# Patient Record
Sex: Female | Born: 1990
Health system: Southern US, Community
[De-identification: ages and names within clinical notes are randomized; demographics above are authoritative.]

## PROBLEM LIST (undated history)

## (undated) ENCOUNTER — Inpatient Hospital Stay (HOSPITAL_COMMUNITY): Payer: Self-pay

## (undated) DIAGNOSIS — D649 Anemia, unspecified: Secondary | ICD-10-CM

## (undated) DIAGNOSIS — K219 Gastro-esophageal reflux disease without esophagitis: Secondary | ICD-10-CM

## (undated) DIAGNOSIS — G43909 Migraine, unspecified, not intractable, without status migrainosus: Secondary | ICD-10-CM

## (undated) DIAGNOSIS — N1330 Unspecified hydronephrosis: Secondary | ICD-10-CM

## (undated) DIAGNOSIS — Z8619 Personal history of other infectious and parasitic diseases: Secondary | ICD-10-CM

## (undated) HISTORY — DX: Gastro-esophageal reflux disease without esophagitis: K21.9

## (undated) HISTORY — PX: NO PAST SURGERIES: SHX2092

## (undated) HISTORY — DX: Anemia, unspecified: D64.9

## (undated) HISTORY — DX: Migraine, unspecified, not intractable, without status migrainosus: G43.909

## (undated) HISTORY — DX: Unspecified hydronephrosis: N13.30

## (undated) HISTORY — DX: Personal history of other infectious and parasitic diseases: Z86.19

---

## 2012-05-12 NOTE — L&D Delivery Note (Signed)
Delivery Note At 1:28 PM a viable and healthy female was delivered via Vaginal, Spontaneous Delivery (Presentation: Middle Occiput Posterior) compound left hand.  APGAR: 8, 9; weight .  6lb 7 oz Placenta status: Intact, Spontaneous. Not sent  Cord: 3 vessels with the following complications: Short.  Cord pH: none  Anesthesia: Epidural  Episiotomy: None Lacerations: 1st degree perineal, left vaginal sulcus Suture Repair: 3.0 chromic Est. Blood Loss (mL): 200  Mom to postpartum.  Baby to nursery-stable.  Cindy Dodson A 01/05/2013, 1:56 PM

## 2012-09-06 LAB — OB RESULTS CONSOLE RPR: RPR: NONREACTIVE

## 2012-09-06 LAB — OB RESULTS CONSOLE ANTIBODY SCREEN: Antibody Screen: NEGATIVE

## 2012-09-06 LAB — OB RESULTS CONSOLE HEPATITIS B SURFACE ANTIGEN: Hepatitis B Surface Ag: NEGATIVE

## 2012-09-06 LAB — OB RESULTS CONSOLE ABO/RH: RH Type: POSITIVE

## 2012-09-06 LAB — OB RESULTS CONSOLE RUBELLA ANTIBODY, IGM: Rubella: IMMUNE

## 2012-09-06 LAB — OB RESULTS CONSOLE HIV ANTIBODY (ROUTINE TESTING): HIV: NONREACTIVE

## 2012-09-14 LAB — OB RESULTS CONSOLE GC/CHLAMYDIA
Chlamydia: NEGATIVE
Gonorrhea: NEGATIVE

## 2012-09-15 ENCOUNTER — Inpatient Hospital Stay (HOSPITAL_COMMUNITY): Admission: AD | Admit: 2012-09-15 | Payer: Self-pay | Source: Ambulatory Visit | Admitting: Obstetrics and Gynecology

## 2012-10-22 LAB — OB RESULTS CONSOLE RPR: RPR: NONREACTIVE

## 2012-11-24 ENCOUNTER — Other Ambulatory Visit: Payer: Self-pay | Admitting: Obstetrics and Gynecology

## 2012-11-24 DIAGNOSIS — N1330 Unspecified hydronephrosis: Secondary | ICD-10-CM

## 2012-11-29 ENCOUNTER — Other Ambulatory Visit: Payer: Self-pay

## 2012-12-15 ENCOUNTER — Ambulatory Visit: Payer: Self-pay | Admitting: Internal Medicine

## 2012-12-28 ENCOUNTER — Encounter (HOSPITAL_COMMUNITY): Payer: Self-pay | Admitting: *Deleted

## 2012-12-28 ENCOUNTER — Telehealth (HOSPITAL_COMMUNITY): Payer: Self-pay | Admitting: *Deleted

## 2012-12-28 NOTE — Telephone Encounter (Signed)
Preadmission screen  

## 2012-12-30 ENCOUNTER — Other Ambulatory Visit: Payer: Self-pay | Admitting: Obstetrics and Gynecology

## 2012-12-31 ENCOUNTER — Telehealth (HOSPITAL_COMMUNITY): Payer: Self-pay | Admitting: *Deleted

## 2013-01-04 ENCOUNTER — Encounter (HOSPITAL_COMMUNITY): Payer: Self-pay

## 2013-01-04 ENCOUNTER — Inpatient Hospital Stay (HOSPITAL_COMMUNITY)
Admission: AD | Admit: 2013-01-04 | Discharge: 2013-01-04 | Disposition: A | Discharge status: Home / Self Care | Payer: BC Managed Care – PPO | Source: Ambulatory Visit | Attending: Obstetrics and Gynecology | Admitting: Obstetrics and Gynecology

## 2013-01-04 DIAGNOSIS — O479 False labor, unspecified: Secondary | ICD-10-CM | POA: Insufficient documentation

## 2013-01-04 LAB — OB RESULTS CONSOLE GBS: GBS: NEGATIVE

## 2013-01-04 NOTE — MAU Note (Signed)
Contractions every 15 minutes since 230 this morning. Denies leaking of fluid or vaginal bleeding. Positive fetal movement.

## 2013-01-05 ENCOUNTER — Encounter (HOSPITAL_COMMUNITY): Payer: Self-pay

## 2013-01-05 ENCOUNTER — Inpatient Hospital Stay (HOSPITAL_COMMUNITY): Payer: BC Managed Care – PPO | Admitting: Anesthesiology

## 2013-01-05 ENCOUNTER — Inpatient Hospital Stay (HOSPITAL_COMMUNITY)
Admission: RE | Admit: 2013-01-05 | Discharge: 2013-01-06 | DRG: 373 | Disposition: A | Discharge status: Home / Self Care | Payer: BC Managed Care – PPO | Source: Ambulatory Visit | Attending: Obstetrics and Gynecology | Admitting: Obstetrics and Gynecology

## 2013-01-05 ENCOUNTER — Encounter (HOSPITAL_COMMUNITY): Payer: Self-pay | Admitting: Anesthesiology

## 2013-01-05 DIAGNOSIS — O328XX0 Maternal care for other malpresentation of fetus, not applicable or unspecified: Secondary | ICD-10-CM | POA: Diagnosis present

## 2013-01-05 LAB — TYPE AND SCREEN
ABO/RH(D): B POS
Antibody Screen: NEGATIVE

## 2013-01-05 LAB — CBC
HCT: 34.8 % — ABNORMAL LOW (ref 36.0–46.0)
Hemoglobin: 11.7 g/dL — ABNORMAL LOW (ref 12.0–15.0)
MCH: 24.6 pg — ABNORMAL LOW (ref 26.0–34.0)
MCHC: 33.6 g/dL (ref 30.0–36.0)
MCV: 73.1 fL — ABNORMAL LOW (ref 78.0–100.0)
Platelets: 198 10*3/uL (ref 150–400)
RBC: 4.76 MIL/uL (ref 3.87–5.11)
RDW: 14 % (ref 11.5–15.5)
WBC: 8.6 10*3/uL (ref 4.0–10.5)

## 2013-01-05 LAB — RPR: RPR Ser Ql: NONREACTIVE

## 2013-01-05 LAB — ABO/RH: ABO/RH(D): B POS

## 2013-01-05 MED ORDER — ACETAMINOPHEN 325 MG PO TABS: 650.0000 mg | ORAL_TABLET | ORAL | Status: DC | PRN

## 2013-01-05 MED ORDER — OXYTOCIN BOLUS FROM INFUSION
500.0000 mL | INTRAVENOUS | Status: DC
  Administered 2013-01-05: 500 mL via INTRAVENOUS

## 2013-01-05 MED ORDER — IBUPROFEN 600 MG PO TABS
600.0000 mg | ORAL_TABLET | Freq: Four times a day (QID) | ORAL | Status: DC
  Administered 2013-01-05 – 2013-01-06 (×4): 600 mg via ORAL
  Filled 2013-01-05 (×4): qty 1

## 2013-01-05 MED ORDER — EPHEDRINE 5 MG/ML INJ
10.0000 mg | INTRAVENOUS | Status: DC | PRN
  Filled 2013-01-05: qty 2
  Filled 2013-01-05: qty 4

## 2013-01-05 MED ORDER — ONDANSETRON HCL 4 MG/2ML IJ SOLN: 4.0000 mg | Freq: Four times a day (QID) | INTRAMUSCULAR | Status: DC | PRN

## 2013-01-05 MED ORDER — FENTANYL 2.5 MCG/ML BUPIVACAINE 1/10 % EPIDURAL INFUSION (WH - ANES)
INTRAMUSCULAR | Status: DC | PRN
  Administered 2013-01-05: 14 mL/h via EPIDURAL

## 2013-01-05 MED ORDER — FERROUS FUMARATE 325 (106 FE) MG PO TABS
1.0000 | ORAL_TABLET | Freq: Every day | ORAL | Status: DC
  Administered 2013-01-05: 106 mg via ORAL
  Filled 2013-01-05 (×3): qty 1

## 2013-01-05 MED ORDER — OXYCODONE-ACETAMINOPHEN 5-325 MG PO TABS
1.0000 | ORAL_TABLET | ORAL | Status: DC | PRN
Start: 2013-01-05 — End: 2013-01-06
  Administered 2013-01-05 – 2013-01-06 (×3): 1 via ORAL
  Filled 2013-01-05 (×3): qty 1

## 2013-01-05 MED ORDER — OXYTOCIN 40 UNITS IN LACTATED RINGERS INFUSION - SIMPLE MED
1.0000 m[IU]/min | INTRAVENOUS | Status: DC
  Administered 2013-01-05: 2 m[IU]/min via INTRAVENOUS
  Filled 2013-01-05: qty 1000

## 2013-01-05 MED ORDER — DIPHENHYDRAMINE HCL 50 MG/ML IJ SOLN: 12.5000 mg | INTRAMUSCULAR | Status: DC | PRN

## 2013-01-05 MED ORDER — CITRIC ACID-SODIUM CITRATE 334-500 MG/5ML PO SOLN: 30.0000 mL | ORAL | Status: DC | PRN

## 2013-01-05 MED ORDER — SENNOSIDES-DOCUSATE SODIUM 8.6-50 MG PO TABS
2.0000 | ORAL_TABLET | Freq: Every day | ORAL | Status: DC
  Administered 2013-01-05: 2 via ORAL

## 2013-01-05 MED ORDER — LANOLIN HYDROUS EX OINT: TOPICAL_OINTMENT | CUTANEOUS | Status: DC | PRN

## 2013-01-05 MED ORDER — SIMETHICONE 80 MG PO CHEW: 80.0000 mg | CHEWABLE_TABLET | ORAL | Status: DC | PRN

## 2013-01-05 MED ORDER — BENZOCAINE-MENTHOL 20-0.5 % EX AERO
1.0000 "application " | INHALATION_SPRAY | CUTANEOUS | Status: DC | PRN
  Administered 2013-01-05: 1 via TOPICAL
  Filled 2013-01-05: qty 56

## 2013-01-05 MED ORDER — FENTANYL 2.5 MCG/ML BUPIVACAINE 1/10 % EPIDURAL INFUSION (WH - ANES)
14.0000 mL/h | INTRAMUSCULAR | Status: DC | PRN
  Filled 2013-01-05: qty 125

## 2013-01-05 MED ORDER — ONDANSETRON HCL 4 MG/2ML IJ SOLN: 4.0000 mg | INTRAMUSCULAR | Status: DC | PRN

## 2013-01-05 MED ORDER — DIBUCAINE 1 % RE OINT: 1.0000 "application " | TOPICAL_OINTMENT | RECTAL | Status: DC | PRN

## 2013-01-05 MED ORDER — ONDANSETRON HCL 4 MG PO TABS: 4.0000 mg | ORAL_TABLET | ORAL | Status: DC | PRN

## 2013-01-05 MED ORDER — LACTATED RINGERS IV SOLN
500.0000 mL | Freq: Once | INTRAVENOUS | Status: AC
  Administered 2013-01-05: 500 mL via INTRAVENOUS

## 2013-01-05 MED ORDER — FLEET ENEMA 7-19 GM/118ML RE ENEM: 1.0000 | ENEMA | Freq: Every day | RECTAL | Status: DC | PRN

## 2013-01-05 MED ORDER — LACTATED RINGERS IV SOLN
INTRAVENOUS | Status: DC
  Administered 2013-01-05 (×2): 125 mL/h via INTRAVENOUS

## 2013-01-05 MED ORDER — DIPHENHYDRAMINE HCL 25 MG PO CAPS: 25.0000 mg | ORAL_CAPSULE | Freq: Four times a day (QID) | ORAL | Status: DC | PRN

## 2013-01-05 MED ORDER — OXYCODONE-ACETAMINOPHEN 5-325 MG PO TABS: 1.0000 | ORAL_TABLET | ORAL | Status: DC | PRN

## 2013-01-05 MED ORDER — PHENYLEPHRINE 40 MCG/ML (10ML) SYRINGE FOR IV PUSH (FOR BLOOD PRESSURE SUPPORT)
80.0000 ug | PREFILLED_SYRINGE | INTRAVENOUS | Status: DC | PRN
  Filled 2013-01-05: qty 2
  Filled 2013-01-05: qty 5

## 2013-01-05 MED ORDER — PHENYLEPHRINE 40 MCG/ML (10ML) SYRINGE FOR IV PUSH (FOR BLOOD PRESSURE SUPPORT)
80.0000 ug | PREFILLED_SYRINGE | INTRAVENOUS | Status: DC | PRN
  Filled 2013-01-05: qty 2

## 2013-01-05 MED ORDER — LIDOCAINE HCL (PF) 1 % IJ SOLN
30.0000 mL | INTRAMUSCULAR | Status: DC | PRN
  Filled 2013-01-05: qty 30

## 2013-01-05 MED ORDER — IBUPROFEN 600 MG PO TABS: 600.0000 mg | ORAL_TABLET | Freq: Four times a day (QID) | ORAL | Status: DC | PRN

## 2013-01-05 MED ORDER — EPHEDRINE 5 MG/ML INJ
10.0000 mg | INTRAVENOUS | Status: DC | PRN
  Filled 2013-01-05: qty 2

## 2013-01-05 MED ORDER — FERROUS SULFATE 325 (65 FE) MG PO TABS: 325.0000 mg | ORAL_TABLET | Freq: Two times a day (BID) | ORAL | Status: DC

## 2013-01-05 MED ORDER — WITCH HAZEL-GLYCERIN EX PADS: 1.0000 "application " | MEDICATED_PAD | CUTANEOUS | Status: DC | PRN

## 2013-01-05 MED ORDER — LIDOCAINE HCL (PF) 1 % IJ SOLN
INTRAMUSCULAR | Status: DC | PRN
  Administered 2013-01-05 (×2): 4 mL

## 2013-01-05 MED ORDER — TERBUTALINE SULFATE 1 MG/ML IJ SOLN: 0.2500 mg | Freq: Once | INTRAMUSCULAR | Status: DC | PRN

## 2013-01-05 MED ORDER — ZOLPIDEM TARTRATE 5 MG PO TABS: 5.0000 mg | ORAL_TABLET | Freq: Every evening | ORAL | Status: DC | PRN

## 2013-01-05 MED ORDER — OXYTOCIN 40 UNITS IN LACTATED RINGERS INFUSION - SIMPLE MED: 62.5000 mL/h | INTRAVENOUS | Status: DC

## 2013-01-05 MED ORDER — PRENATAL MULTIVITAMIN CH
1.0000 | ORAL_TABLET | Freq: Every day | ORAL | Status: DC
  Administered 2013-01-06: 1 via ORAL
  Filled 2013-01-05: qty 1

## 2013-01-05 MED ORDER — LACTATED RINGERS IV SOLN: 500.0000 mL | INTRAVENOUS | Status: DC | PRN

## 2013-01-05 NOTE — H&P (Signed)
Cindy Dodson is a 22 y.o. female presenting for induction  @ 39 weeks 2nd to favorable cervix   OB History   Grav Para Term Preterm Abortions TAB SAB Ect Mult Living   2 1 1  0 0 0 0 0 0 1     Past Medical History  Diagnosis Date  . GERD (gastroesophageal reflux disease)   . Hx of varicella   . Anemia     microcytic hypochromic anemia  . Hydronephrosis    Past Surgical History  Procedure Laterality Date  . No past surgeries     Family History: family history includes Cancer in her cousin, father, and paternal grandfather; Diabetes in her maternal aunt, maternal grandfather, maternal grandmother, maternal uncle, and paternal grandmother. Social History:  reports that she has never smoked. She has never used smokeless tobacco. She reports that she does not drink alcohol or use illicit drugs.  ROS:  Negative  General ROS: negative Genito-Urinary ROS: negative  Dilation: 4 Effacement (%): 70 Station: -3 Exam by:: Dr Cherly Hensen Blood pressure 129/84, pulse 82, temperature 97.7 F (36.5 C), temperature source Oral, resp. rate 20, height 5\' 4"  (1.626 m), weight 107.502 kg (237 lb). PHYSICAL EXAM: General: alert, cooperative and no distress Resp: clear to auscultation bilaterally Cardio: regular rate and rhythm, S1, S2 normal, no murmur, click, rub or gallop GI: gravid nontender Extremities: no edema, redness or tenderness in the calves or thighs   Prenatal labs: ABO, Rh: B/Positive/-- (04/28 0000) Antibody: Negative (04/28 0000) Rubella:  Immune RPR: Nonreactive (06/13 0000)  HBsAg: Negative (04/28 0000)  HIV: Non-reactive (04/28 0000)  GBS: Negative (08/26 0000)   Assessment/Plan: Term gestation GBS cx neg P) admit routine labs. Amniotomy. IV PCN Epidural   Cindy Dodson A 01/05/2013, 8:57 AM

## 2013-01-05 NOTE — Anesthesia Procedure Notes (Signed)
Epidural Patient location during procedure: OB Start time: 01/05/2013 10:21 AM  Staffing Anesthesiologist: Ozell Ferrera A. Performed by: anesthesiologist   Preanesthetic Checklist Completed: patient identified, site marked, surgical consent, pre-op evaluation, timeout performed, IV checked, risks and benefits discussed and monitors and equipment checked  Epidural Patient position: sitting Prep: site prepped and draped and DuraPrep Patient monitoring: continuous pulse ox and blood pressure Approach: midline Injection technique: LOR air  Needle:  Needle type: Tuohy  Needle gauge: 17 G Needle length: 9 cm and 9 Needle insertion depth: 7 cm Catheter type: closed end flexible Catheter size: 19 Gauge Catheter at skin depth: 12 cm Test dose: negative and Other  Assessment Events: blood not aspirated, injection not painful, no injection resistance, negative IV test and no paresthesia  Additional Notes Patient identified. Risks and benefits discussed including failed block, incomplete  Pain control, post dural puncture headache, nerve damage, paralysis, blood pressure Changes, nausea, vomiting, reactions to medications-both toxic and allergic and post Partum back pain. All questions were answered. Patient expressed understanding and wished to proceed. Sterile technique was used throughout procedure. Epidural site was Dressed with sterile barrier dressing. No paresthesias, signs of intravascular injection Or signs of intrathecal spread were encountered.  Patient was more comfortable after the epidural was dosed. Please see RN's note for documentation of vital signs and FHR which are stable.

## 2013-01-05 NOTE — Anesthesia Postprocedure Evaluation (Signed)
Anesthesia Post Note  Patient: Cindy Dodson  Procedure(s) Performed: * No procedures listed *  Anesthesia type: Epidural  Patient location: Mother/Baby  Post pain: Pain level controlled  Post assessment: Post-op Vital signs reviewed  Last Vitals:  Filed Vitals:   01/05/13 1713  BP: 134/79  Pulse: 88  Temp: 36.9 C  Resp: 22    Post vital signs: Reviewed  Level of consciousness: awake  Complications: No apparent anesthesia complications

## 2013-01-05 NOTE — Anesthesia Preprocedure Evaluation (Signed)
Anesthesia Evaluation  Patient identified by MRN, date of birth, ID band Patient awake    Reviewed: Allergy & Precautions, H&P , Patient's Chart, lab work & pertinent test results  Airway Mallampati: III TM Distance: >3 FB Neck ROM: Full    Dental no notable dental hx. (+) Teeth Intact   Pulmonary neg pulmonary ROS,  breath sounds clear to auscultation  Pulmonary exam normal       Cardiovascular Rhythm:Regular Rate:Normal     Neuro/Psych negative neurological ROS  negative psych ROS   GI/Hepatic GERD-  Medicated and Controlled,  Endo/Other  Morbid obesity  Renal/GU Renal disease  negative genitourinary   Musculoskeletal negative musculoskeletal ROS (+)   Abdominal (+) + obese,   Peds  Hematology  (+) Blood dyscrasia, anemia ,   Anesthesia Other Findings   Reproductive/Obstetrics                           Anesthesia Physical Anesthesia Plan  ASA: III  Anesthesia Plan: Epidural   Post-op Pain Management:    Induction:   Airway Management Planned: Natural Airway  Additional Equipment:   Intra-op Plan:   Post-operative Plan:   Informed Consent: I have reviewed the patients History and Physical, chart, labs and discussed the procedure including the risks, benefits and alternatives for the proposed anesthesia with the patient or authorized representative who has indicated his/her understanding and acceptance.   Dental advisory given  Plan Discussed with: Anesthesiologist  Anesthesia Plan Comments:         Anesthesia Quick Evaluation

## 2013-01-06 LAB — CBC
HCT: 33.1 % — ABNORMAL LOW (ref 36.0–46.0)
Hemoglobin: 11.1 g/dL — ABNORMAL LOW (ref 12.0–15.0)
MCH: 24.6 pg — ABNORMAL LOW (ref 26.0–34.0)
MCHC: 33.5 g/dL (ref 30.0–36.0)
MCV: 73.4 fL — ABNORMAL LOW (ref 78.0–100.0)
Platelets: 177 10*3/uL (ref 150–400)
RBC: 4.51 MIL/uL (ref 3.87–5.11)
RDW: 14.2 % (ref 11.5–15.5)
WBC: 9.5 10*3/uL (ref 4.0–10.5)

## 2013-01-06 MED ORDER — OXYCODONE-ACETAMINOPHEN 5-325 MG PO TABS: 1.0000 | ORAL_TABLET | Freq: Four times a day (QID) | ORAL | Status: DC | PRN

## 2013-01-06 MED ORDER — IBUPROFEN 600 MG PO TABS: 600.0000 mg | ORAL_TABLET | Freq: Four times a day (QID) | ORAL | Status: DC

## 2013-01-06 NOTE — Lactation Note (Signed)
This note was copied from the chart of Cindy Dodson. Lactation Consultation Note Mom states breast feeding is going very well; c/o sore nipples during feedings. Discussed position and latch and the importance of using good pillow support for every feeding to maintain position and latch. Mom admits that she has not been using many pillows. Mom states she is going to use more pillow support and be more careful with getting a wide latch. Otherwise, mom states she does not have any other concerns. Enc mom to call if she has any questions or concerns.   Patient Name: Cindy Fleta Borgeson [id number] Date: 01/06/2013     Maternal Data    Feeding    LATCH Score/Interventions                      Lactation Tools Discussed/Used     Consult Status      Lenard Forth 01/06/2013, 11:47 AM

## 2013-01-06 NOTE — Progress Notes (Signed)
Patient ID: MARAL LAMPE, female   DOB: 1991/01/27, 22 y.o.   MRN: [id number] PPD # 1 SVD  S:  Reports feeling well             Tolerating po/ No nausea or vomiting             Bleeding is light             Pain controlled with ibuprofen (OTC) and narcotic analgesics including Percocet             Up ad lib / ambulatory / voiding without difficulties    Newborn  Information for the patient's newborn:  Hatsumi, Steinhart [[id number]]  female  breast feeding  / Circumcision done   O:  A & O x 3, in no apparent distress, very pleasant             VS:  Filed Vitals:   01/05/13 1532 01/05/13 1713 01/05/13 2110 01/06/13 0545  BP: 136/86 134/79 133/81 119/75  Pulse: 73 88 88 73  Temp: 98.7 F (37.1 C) 98.4 F (36.9 C) 97.9 F (36.6 C) 97.8 F (36.6 C)  TempSrc: Axillary Axillary Oral Oral  Resp: 20 22 18 20   Height:      Weight:      SpO2: 100% 100%      LABS:  Recent Labs  01/05/13 0900 01/06/13 0600  WBC 8.6 9.5  HGB 11.7* 11.1*  HCT 34.8* 33.1*  PLT 198 177    Blood type: --/--/B POS, B POS (08/27 0900)  Rubella: Immune (04/28 0000)     Lungs: Clear and unlabored  Heart: regular rate and rhythm / no murmurs  Abdomen: soft, non-tender, non-distended , normal bowel sounds             Fundus: firm, non-tender, U-1  Perineum: 1st degree repair healing well, no edema  Lochia: scant  Extremities: trace pedal edema, no calf pain or tenderness, no Homans    A/P: PPD # 1  22 y.o., [id number]   Active Problems:   Postpartum care following vaginal delivery  (8/27)   Doing well - stable status  Routine post partum orders  Increase water intake  Anticipate discharge tomorrow    Raelyn Mora, M, MSN, CNM 01/06/2013, 10:28 AM

## 2013-01-06 NOTE — Lactation Note (Signed)
This note was copied from the chart of Boy Tyrah Broers. Lactation Consultation Note MD request feeding assessment prior to discharge.  Baby showing feeding cues; mom ready to feed baby; mom preparing to feed with insufficient pillow support. Discussed using more pillows to get baby higher to the breast. Baby latched on with shallow latch; discussed with mom how to make latch wider to protect the nipples. Mom then states she feels more comfortable with the latch. Nipples hurt "a little", but more comfortable with good position and latch. Mom has no other concerns at this time.  Enc mom to continue frequent STS and cue based feeding, and to call if she has any concerns.   Patient Name: Boy Trinette Vera [id number] Date: 01/06/2013 Reason for consult: Follow-up assessment;MD order   Maternal Data    Feeding Feeding Type: Breast Milk  LATCH Score/Interventions Latch: Grasps breast easily, tongue down, lips flanged, rhythmical sucking.  Audible Swallowing: Spontaneous and intermittent  Type of Nipple: Everted at rest and after stimulation  Comfort (Breast/Nipple): Filling, red/small blisters or bruises, mild/mod discomfort  Problem noted: Mild/Moderate discomfort Interventions (Mild/moderate discomfort): Hand massage;Hand expression  Hold (Positioning): Assistance needed to correctly position infant at breast and maintain latch. Intervention(s): Support Pillows;Skin to skin  LATCH Score: 8  Lactation Tools Discussed/Used     Consult Status Consult Status: Complete    Lenard Forth 01/06/2013, 3:13 PM

## 2013-01-07 NOTE — Discharge Summary (Signed)
OBSTETRICAL DISCHARGE SUMMARY   Patient ID: Cindy Dodson MRN: [id number] DOB/AGE: Jun 22, 1990 22 y.o.  Admit date: 01/05/2013 Admission Diagnoses: IOL @ 39wks     Discharge date: 01/06/2013  Discharge Diagnoses: Term pregnancy, delivered  Reason for Admission: induction of labor  Prenatal history: G2P2002   EDC : 01/12/2013, by Other Basis  Prenatal care at [facility]  Primary provider : Anagabriela Jokerst Prenatal course complicated by anemia in pregnancy  Prenatal Labs: ABO, Rh: --/--/B POS, B POS (08/27 0900)  Antibody: NEG (08/27 0900) Rubella: Immune RPR: NON REACTIVE (08/27 0900)  HBsAg: Negative (04/28 0000)  HIV: Non-reactive (04/28 0000)  GBS: Negative (08/26 0000)   Labor Summary: IOL @ 39 weeks d/t favorable cervix / amniotomy / progressed to complete without complications /SVD of female infant / 1st degree perineal laceration and left vaginal sulcus laceration - repaired   Anesthesia: epidural Procedures: amniotomy Complications: 1st degree and vaginal sulcus laceration  Newborn Data:  Gender: female Feeding method : breast Circumcision: yes Weight: 6lbs 7oz Apgar: 8/9  Discharge Information: Condition: stable Activity: pelvic rest Diet: routine Medications: PNV, Ibuprofen and Percocet   Instructions: [name] / instructions reviewed Discharge to: home Follow up : [name] (Dr. Cherly Hensen) at 6 weeks postpartum  Signed: Kenard Gower, MSN, CNM 01/07/2013, 5:38 PM

## 2013-01-12 ENCOUNTER — Inpatient Hospital Stay (HOSPITAL_COMMUNITY): Payer: BC Managed Care – PPO

## 2013-06-08 ENCOUNTER — Telehealth: Payer: Self-pay | Admitting: General Practice

## 2013-06-08 NOTE — Telephone Encounter (Addendum)
Pt states you see both of her children and would like to know if you would accept her as a pt to be worked in really soon.? Pt  Is having frequent headaches and your first available in may/ poss April appt on a Wed

## 2013-06-09 ENCOUNTER — Ambulatory Visit: Payer: BC Managed Care – PPO | Admitting: Family

## 2013-06-12 NOTE — Telephone Encounter (Signed)
Can use Wednesday feb 18th 30 minute appt.

## 2013-06-13 NOTE — Telephone Encounter (Signed)
lmom for pt to make appt.

## 2013-06-15 NOTE — Telephone Encounter (Signed)
Done

## 2013-06-29 ENCOUNTER — Ambulatory Visit (INDEPENDENT_AMBULATORY_CARE_PROVIDER_SITE_OTHER): Payer: BC Managed Care – PPO | Admitting: Internal Medicine

## 2013-06-29 ENCOUNTER — Encounter: Payer: Self-pay | Admitting: Internal Medicine

## 2013-06-29 VITALS — BP 140/90 | HR 78 | Temp 98.4°F | Ht 64.5 in | Wt 209.0 lb

## 2013-06-29 DIAGNOSIS — Z833 Family history of diabetes mellitus: Secondary | ICD-10-CM | POA: Insufficient documentation

## 2013-06-29 DIAGNOSIS — L989 Disorder of the skin and subcutaneous tissue, unspecified: Secondary | ICD-10-CM | POA: Insufficient documentation

## 2013-06-29 DIAGNOSIS — Z23 Encounter for immunization: Secondary | ICD-10-CM

## 2013-06-29 DIAGNOSIS — Z299 Encounter for prophylactic measures, unspecified: Secondary | ICD-10-CM

## 2013-06-29 DIAGNOSIS — R519 Headache, unspecified: Secondary | ICD-10-CM | POA: Insufficient documentation

## 2013-06-29 DIAGNOSIS — L819 Disorder of pigmentation, unspecified: Secondary | ICD-10-CM

## 2013-06-29 DIAGNOSIS — Z801 Family history of malignant neoplasm of trachea, bronchus and lung: Secondary | ICD-10-CM | POA: Insufficient documentation

## 2013-06-29 DIAGNOSIS — R51 Headache: Secondary | ICD-10-CM

## 2013-06-29 DIAGNOSIS — D239 Other benign neoplasm of skin, unspecified: Secondary | ICD-10-CM

## 2013-06-29 NOTE — Assessment & Plan Note (Signed)
recent one sided ha prob migraine variant  Ho given tracking  And fu if  persistent or progressive

## 2013-06-29 NOTE — Patient Instructions (Addendum)
Headaches sound like migraiune  Track and fu if  persistent or progressive sometimes 2 aleve at beginning is helpful.Continue lifestyle intervention healthy eating and exercise . Get lab appt fasting preferred but water ok. 150 minutes of exercise weeks  ,  Lose weight  To healthy levels. Avoid trans fats and processed foods;  Increase fresh fruits and veges to 5 servings per day. And avoid sweet beverages  Including tea and juice.  Will arrange a derm consult to check the arm area .  Fu  depending   On labs  Or every 1-2 years      Insomnia Insomnia is frequent trouble falling and/or staying asleep. Insomnia can be a long term problem or a short term problem. Both are common. Insomnia can be a short term problem when the wakefulness is related to a certain stress or worry. Long term insomnia is often related to ongoing stress during waking hours and/or poor sleeping habits. Overtime, sleep deprivation itself can make the problem worse. Every little thing feels more severe because you are overtired and your ability to cope is decreased. CAUSES   Stress, anxiety, and depression.  Poor sleeping habits.  Distractions such as TV in the bedroom.  Naps close to bedtime.  Engaging in emotionally charged conversations before bed.  Technical reading before sleep.  Alcohol and other sedatives. They may make the problem worse. They can hurt normal sleep patterns and normal dream activity.  Stimulants such as caffeine for several hours prior to bedtime.  Pain syndromes and shortness of breath can cause insomnia.  Exercise late at night.  Changing time zones may cause sleeping problems (jet lag). It is sometimes helpful to have someone observe your sleeping patterns. They should look for periods of not breathing during the night (sleep apnea). They should also look to see how long those periods last. If you live alone or observers are uncertain, you can also be observed at a sleep clinic where  your sleep patterns will be professionally monitored. Sleep apnea requires a checkup and treatment. Give your caregivers your medical history. Give your caregivers observations your family has made about your sleep.  SYMPTOMS   Not feeling rested in the morning.  Anxiety and restlessness at bedtime.  Difficulty falling and staying asleep. TREATMENT   Your caregiver may prescribe treatment for an underlying medical disorders. Your caregiver can give advice or help if you are using alcohol or other drugs for self-medication. Treatment of underlying problems will usually eliminate insomnia problems.  Medications can be prescribed for short time use. They are generally not recommended for lengthy use.  Over-the-counter sleep medicines are not recommended for lengthy use. They can be habit forming.  You can promote easier sleeping by making lifestyle changes such as:  Using relaxation techniques that help with breathing and reduce muscle tension.  Exercising earlier in the day.  Changing your diet and the time of your last meal. No night time snacks.  Establish a regular time to go to bed.  Counseling can help with stressful problems and worry.  Soothing music and white noise may be helpful if there are background noises you cannot remove.  Stop tedious detailed work at least one hour before bedtime. HOME CARE INSTRUCTIONS   Keep a diary. Inform your caregiver about your progress. This includes any medication side effects. See your caregiver regularly. Take note of:  Times when you are asleep.  Times when you are awake during the night.  The quality of your sleep.  How you feel the next day. This information will help your caregiver care for you.  Get out of bed if you are still awake after 15 minutes. Read or do some quiet activity. Keep the lights down. Wait until you feel sleepy and go back to bed.  Keep regular sleeping and waking hours. Avoid naps.  Exercise  regularly.  Avoid distractions at bedtime. Distractions include watching television or engaging in any intense or detailed activity like attempting to balance the household checkbook.  Develop a bedtime ritual. Keep a familiar routine of bathing, brushing your teeth, climbing into bed at the same time each night, listening to soothing music. Routines increase the success of falling to sleep faster.  Use relaxation techniques. This can be using breathing and muscle tension release routines. It can also include visualizing peaceful scenes. You can also help control troubling or intruding thoughts by keeping your mind occupied with boring or repetitive thoughts like the old concept of counting sheep. You can make it more creative like imagining planting one beautiful flower after another in your backyard garden.  During your day, work to eliminate stress. When this is not possible use some of the previous suggestions to help reduce the anxiety that accompanies stressful situations. MAKE SURE YOU:   Understand these instructions.  Will watch your condition.  Will get help right away if you are not doing well or get worse. Document Released: 04/25/2000 Document Revised: 07/21/2011 Document Reviewed: 05/26/2007 [facility] Patient Information 2014 [place].  Exercise to Lose Weight Exercise and a healthy diet may help you lose weight. Your doctor may suggest specific exercises. EXERCISE IDEAS AND TIPS  Choose low-cost things you enjoy doing, such as walking, bicycling, or exercising to workout videos.  Take stairs instead of the elevator.  Walk during your lunch break.  Park your car further away from work or school.  Go to a gym or an exercise class.  Start with 5 to 10 minutes of exercise each day. Build up to 30 minutes of exercise 4 to 6 days a week.  Wear shoes with good support and comfortable clothes.  Stretch before and after working out.  Work out until you breathe  harder and your heart beats faster.  Drink extra water when you exercise.  Do not do so much that you hurt yourself, feel dizzy, or get very short of breath. Exercises that burn about 150 calories:  Running 1  miles in 15 minutes.  Playing volleyball for 45 to 60 minutes.  Washing and waxing a car for 45 to 60 minutes.  Playing touch football for 45 minutes.  Walking 1  miles in 35 minutes.  Pushing a stroller 1  miles in 30 minutes.  Playing basketball for 30 minutes.  Raking leaves for 30 minutes.  Bicycling 5 miles in 30 minutes.  Walking 2 miles in 30 minutes.  Dancing for 30 minutes.  Shoveling snow for 15 minutes.  Swimming laps for 20 minutes.  Walking up stairs for 15 minutes.  Bicycling 4 miles in 15 minutes.  Gardening for 30 to 45 minutes.  Jumping rope for 15 minutes.  Washing windows or floors for 45 to 60 minutes. Document Released: 05/31/2010 Document Revised: 07/21/2011 Document Reviewed: 05/31/2010 [facility] Patient Information 2014 [place], [place].

## 2013-06-29 NOTE — Progress Notes (Signed)
Pre visit review using our clinic review tool, if applicable. No additional management support is needed unless otherwise documented below in the visit note.   Chief Complaint  Patient presents with  . Establish Care    HPI: Patient comes in as new patient visit . Previous care has been via her ob gyne dr cousins  Children are in our practice 6 almost 6 months and 25 year old.   generally well but wants to be healthy after birth of her last child trying to lose weight. Using apps and tracking ancd this is helping   Had migraine has when younger  And then better. Recently had a problematic HA   HA right neck  To right head  Went away after 2 days otc and other ? If migraines  phonophobia   ROS: See pertinent positives and negatives per HPI.sometimes sleep an issue  Has skin area right arm for at least ayear but incr size and now different color s  iud  Irreg. Menses at present  Habits  ocass etoh; cold starbucks cold  More not as much caffeine.   Working at about 30 per week  .[employer] to increase to FT.   Past Medical History  Diagnosis Date  . GERD (gastroesophageal reflux disease)   . Hx of varicella   . Anemia     microcytic hypochromic anemia  . Hydronephrosis   . Migraine     Family History  Problem Relation Age of Onset  . Lung cancer Father     non smoker raised with ets   . Diabetes Maternal Aunt   . Anemia Maternal Aunt   . Diabetes Maternal Uncle   . Diabetes Maternal Grandmother   . Diabetes Maternal Grandfather   . Stroke Maternal Grandfather   . Heart disease Maternal Grandfather   . Diabetes Paternal Grandmother   . Lung cancer Paternal Grandfather     lung  . Cancer - Lung Cousin   . Anemia Sister   . Thyroid disease Sister     ra1 ablation?    History   Social History  . Marital Status: Single    Spouse Name: N/A    Number of Children: 2  . Years of Education: N/A   Occupational History  . [place] History Main Topics   . Smoking status: Never Smoker   . Smokeless tobacco: Never Used  . Alcohol Use: Yes     Comment: Occasional glass of wine  . Drug Use: No  . Sexual Activity: Yes    Birth Control/ Protection: None   Other Topics Concern  . None   Social History Narrative   Averages 9 hours of sleep per night   6 people living in the home 2 young children    2 indoor dogs      [employer]    g2 p2 single     Outpatient Encounter Prescriptions as of 06/29/2013  Medication Sig  . levonorgestrel (MIRENA) 20 MCG/24HR IUD 1 each by Intrauterine route once.  . [DISCONTINUED] ferrous fumarate (HEMOCYTE - 106 MG FE) 325 (106 FE) MG TABS tablet Take 1 tablet by mouth.  . [DISCONTINUED] ibuprofen (ADVIL,MOTRIN) 600 MG tablet Take 1 tablet (600 mg total) by mouth every 6 (six) hours.  . [DISCONTINUED] oxyCODONE-acetaminophen (PERCOCET/ROXICET) 5-325 MG per tablet Take 1-2 tablets by mouth every 6 (six) hours as needed for pain.  . [DISCONTINUED] Prenatal Vit-Fe Fumarate-FA (PRENATAL MULTIVITAMIN) TABS tablet Take 1  tablet by mouth daily at 12 noon.    EXAM:  BP 140/90  Pulse 78  Temp(Src) 98.4 F (36.9 C) (Oral)  Ht 5' 4.5" (1.638 m)  Wt 209 lb (94.802 kg)  BMI 35.33 kg/m2  SpO2 98%  Breastfeeding? No  Body mass index is 35.33 kg/(m^2).  GENERAL: vitals reviewed and listed above, alert, oriented, appears well hydrated and in no acute distress HEENT: atraumatic, conjunctiva  clear, no obvious abnormalities on inspection of external nose and ears OP : no lesion edema or exudate  NECK: no obvious masses on inspection palpation ? If pal thyroid no LN LUNGS: clear to auscultation bilaterally, no wheezes, rales or rhonchi, good air movement CV: HRRR, no clubbing cyanosis or  peripheral edema nl cap refill  MS: moves all extremities without noticeable focal  abnormality Abdomen:  Sof,t normal bowel sounds without hepatosplenomegaly, no guarding rebound or masses no CVA tenderness Skin right  arm palpable pigmented with central  pink area 3 mm non tender  PSYCH: pleasant and cooperative, no obvious depression or anxiety Wt Readings from Last 3 Encounters:  06/29/13 209 lb (94.802 kg)  01/05/13 237 lb (107.502 kg)  01/04/13 239 lb 3.2 oz (108.5 kg)    ASSESSMENT AND PLAN:  Discussed the following assessment and plan:  Headache - Plan: CBC with Differential, Lipid panel, TSH, Hepatic function panel, T4, free, Basic metabolic panel  Family history of diabetes mellitus - Plan: CBC with Differential, Lipid panel, TSH, Hepatic function panel, T4, free, Basic metabolic panel  Family history of lung cancer - Plan: CBC with Differential, Lipid panel, TSH, Hepatic function panel, T4, free, Basic metabolic panel  Skin lesion - Plan: CBC with Differential, Lipid panel, TSH, Hepatic function panel, T4, free, Basic metabolic panel, Ambulatory referral to Dermatology  Need for prophylactic vaccination and inoculation against influenza - Plan: Flu Vaccine QUAD 36+ mos PF IM (Fluarix)  Preventive measure - Plan: CBC with Differential, Lipid panel, TSH, Hepatic function panel, T4, free, Basic metabolic panel  Changing pigmented skin lesion - Plan: Ambulatory referral to Dermatology  -Patient advised to return or notify health care team  if symptoms worsen or persist or new concerns arise.  Patient Instructions  Headaches sound like migraiune  Track and fu if  persistent or progressive sometimes 2 aleve at beginning is helpful.Continue lifestyle intervention healthy eating and exercise . Get lab appt fasting preferred but water ok. 150 minutes of exercise weeks  ,  Lose weight  To healthy levels. Avoid trans fats and processed foods;  Increase fresh fruits and veges to 5 servings per day. And avoid sweet beverages  Including tea and juice.  Will arrange a derm consult to check the arm area .  Fu  depending   On labs  Or every 1-2 years      Insomnia Insomnia is frequent trouble  falling and/or staying asleep. Insomnia can be a long term problem or a short term problem. Both are common. Insomnia can be a short term problem when the wakefulness is related to a certain stress or worry. Long term insomnia is often related to ongoing stress during waking hours and/or poor sleeping habits. Overtime, sleep deprivation itself can make the problem worse. Every little thing feels more severe because you are overtired and your ability to cope is decreased. CAUSES   Stress, anxiety, and depression.  Poor sleeping habits.  Distractions such as TV in the bedroom.  Naps close to bedtime.  Engaging in emotionally charged conversations before  bed.  Technical reading before sleep.  Alcohol and other sedatives. They may make the problem worse. They can hurt normal sleep patterns and normal dream activity.  Stimulants such as caffeine for several hours prior to bedtime.  Pain syndromes and shortness of breath can cause insomnia.  Exercise late at night.  Changing time zones may cause sleeping problems (jet lag). It is sometimes helpful to have someone observe your sleeping patterns. They should look for periods of not breathing during the night (sleep apnea). They should also look to see how long those periods last. If you live alone or observers are uncertain, you can also be observed at a sleep clinic where your sleep patterns will be professionally monitored. Sleep apnea requires a checkup and treatment. Give your caregivers your medical history. Give your caregivers observations your family has made about your sleep.  SYMPTOMS   Not feeling rested in the morning.  Anxiety and restlessness at bedtime.  Difficulty falling and staying asleep. TREATMENT   Your caregiver may prescribe treatment for an underlying medical disorders. Your caregiver can give advice or help if you are using alcohol or other drugs for self-medication. Treatment of underlying problems will usually  eliminate insomnia problems.  Medications can be prescribed for short time use. They are generally not recommended for lengthy use.  Over-the-counter sleep medicines are not recommended for lengthy use. They can be habit forming.  You can promote easier sleeping by making lifestyle changes such as:  Using relaxation techniques that help with breathing and reduce muscle tension.  Exercising earlier in the day.  Changing your diet and the time of your last meal. No night time snacks.  Establish a regular time to go to bed.  Counseling can help with stressful problems and worry.  Soothing music and white noise may be helpful if there are background noises you cannot remove.  Stop tedious detailed work at least one hour before bedtime. HOME CARE INSTRUCTIONS   Keep a diary. Inform your caregiver about your progress. This includes any medication side effects. See your caregiver regularly. Take note of:  Times when you are asleep.  Times when you are awake during the night.  The quality of your sleep.  How you feel the next day. This information will help your caregiver care for you.  Get out of bed if you are still awake after 15 minutes. Read or do some quiet activity. Keep the lights down. Wait until you feel sleepy and go back to bed.  Keep regular sleeping and waking hours. Avoid naps.  Exercise regularly.  Avoid distractions at bedtime. Distractions include watching television or engaging in any intense or detailed activity like attempting to balance the household checkbook.  Develop a bedtime ritual. Keep a familiar routine of bathing, brushing your teeth, climbing into bed at the same time each night, listening to soothing music. Routines increase the success of falling to sleep faster.  Use relaxation techniques. This can be using breathing and muscle tension release routines. It can also include visualizing peaceful scenes. You can also help control troubling or  intruding thoughts by keeping your mind occupied with boring or repetitive thoughts like the old concept of counting sheep. You can make it more creative like imagining planting one beautiful flower after another in your backyard garden.  During your day, work to eliminate stress. When this is not possible use some of the previous suggestions to help reduce the anxiety that accompanies stressful situations. MAKE SURE YOU:  Understand these instructions.  Will watch your condition.  Will get help right away if you are not doing well or get worse. Document Released: 04/25/2000 Document Revised: 07/21/2011 Document Reviewed: 05/26/2007 [facility] Patient Information 2014 [place].  Exercise to Lose Weight Exercise and a healthy diet may help you lose weight. Your doctor may suggest specific exercises. EXERCISE IDEAS AND TIPS  Choose low-cost things you enjoy doing, such as walking, bicycling, or exercising to workout videos.  Take stairs instead of the elevator.  Walk during your lunch break.  Park your car further away from work or school.  Go to a gym or an exercise class.  Start with 5 to 10 minutes of exercise each day. Build up to 30 minutes of exercise 4 to 6 days a week.  Wear shoes with good support and comfortable clothes.  Stretch before and after working out.  Work out until you breathe harder and your heart beats faster.  Drink extra water when you exercise.  Do not do so much that you hurt yourself, feel dizzy, or get very short of breath. Exercises that burn about 150 calories:  Running 1  miles in 15 minutes.  Playing volleyball for 45 to 60 minutes.  Washing and waxing a car for 45 to 60 minutes.  Playing touch football for 45 minutes.  Walking 1  miles in 35 minutes.  Pushing a stroller 1  miles in 30 minutes.  Playing basketball for 30 minutes.  Raking leaves for 30 minutes.  Bicycling 5 miles in 30 minutes.  Walking 2 miles in 30  minutes.  Dancing for 30 minutes.  Shoveling snow for 15 minutes.  Swimming laps for 20 minutes.  Walking up stairs for 15 minutes.  Bicycling 4 miles in 15 minutes.  Gardening for 30 to 45 minutes.  Jumping rope for 15 minutes.  Washing windows or floors for 45 to 60 minutes. Document Released: 05/31/2010 Document Revised: 07/21/2011 Document Reviewed: 05/31/2010 [facility] Patient Information 2014 [place], [place].       Standley Brooking. Panosh M.D.

## 2013-07-17 ENCOUNTER — Encounter (HOSPITAL_COMMUNITY): Payer: Self-pay | Admitting: Emergency Medicine

## 2013-07-17 ENCOUNTER — Emergency Department (HOSPITAL_COMMUNITY): Payer: BC Managed Care – PPO

## 2013-07-17 ENCOUNTER — Emergency Department (HOSPITAL_COMMUNITY)
Admission: EM | Admit: 2013-07-17 | Discharge: 2013-07-18 | Disposition: A | Discharge status: Home / Self Care | Payer: BC Managed Care – PPO | Attending: Emergency Medicine | Admitting: Emergency Medicine

## 2013-07-17 DIAGNOSIS — Z8679 Personal history of other diseases of the circulatory system: Secondary | ICD-10-CM | POA: Insufficient documentation

## 2013-07-17 DIAGNOSIS — K5792 Diverticulitis of intestine, part unspecified, without perforation or abscess without bleeding: Secondary | ICD-10-CM

## 2013-07-17 DIAGNOSIS — Z862 Personal history of diseases of the blood and blood-forming organs and certain disorders involving the immune mechanism: Secondary | ICD-10-CM | POA: Insufficient documentation

## 2013-07-17 DIAGNOSIS — Z87448 Personal history of other diseases of urinary system: Secondary | ICD-10-CM | POA: Insufficient documentation

## 2013-07-17 DIAGNOSIS — Z8619 Personal history of other infectious and parasitic diseases: Secondary | ICD-10-CM | POA: Insufficient documentation

## 2013-07-17 DIAGNOSIS — K5732 Diverticulitis of large intestine without perforation or abscess without bleeding: Secondary | ICD-10-CM | POA: Insufficient documentation

## 2013-07-17 DIAGNOSIS — Z3202 Encounter for pregnancy test, result negative: Secondary | ICD-10-CM | POA: Insufficient documentation

## 2013-07-17 LAB — CBC WITH DIFFERENTIAL/PLATELET
Basophils Absolute: 0 10*3/uL (ref 0.0–0.1)
Basophils Relative: 0 % (ref 0–1)
Eosinophils Absolute: 0.1 10*3/uL (ref 0.0–0.7)
Eosinophils Relative: 1 % (ref 0–5)
HCT: 39 % (ref 36.0–46.0)
Hemoglobin: 13.4 g/dL (ref 12.0–15.0)
Lymphocytes Relative: 11 % — ABNORMAL LOW (ref 12–46)
Lymphs Abs: 1 10*3/uL (ref 0.7–4.0)
MCH: 27 pg (ref 26.0–34.0)
MCHC: 34.4 g/dL (ref 30.0–36.0)
MCV: 78.5 fL (ref 78.0–100.0)
Monocytes Absolute: 0.7 10*3/uL (ref 0.1–1.0)
Monocytes Relative: 7 % (ref 3–12)
Neutro Abs: 7.8 10*3/uL — ABNORMAL HIGH (ref 1.7–7.7)
Neutrophils Relative %: 82 % — ABNORMAL HIGH (ref 43–77)
Platelets: 188 10*3/uL (ref 150–400)
RBC: 4.97 MIL/uL (ref 3.87–5.11)
RDW: 12.6 % (ref 11.5–15.5)
WBC: 9.5 10*3/uL (ref 4.0–10.5)

## 2013-07-17 LAB — COMPREHENSIVE METABOLIC PANEL
ALT: 8 U/L (ref 0–35)
AST: 12 U/L (ref 0–37)
Albumin: 4 g/dL (ref 3.5–5.2)
Alkaline Phosphatase: 44 U/L (ref 39–117)
BUN: 11 mg/dL (ref 6–23)
CO2: 24 mEq/L (ref 19–32)
Calcium: 9 mg/dL (ref 8.4–10.5)
Chloride: 101 mEq/L (ref 96–112)
Creatinine, Ser: 0.85 mg/dL (ref 0.50–1.10)
GFR calc Af Amer: >90 mL/min (ref >90)
GFR calc non Af Amer: >90 mL/min (ref >90)
Glucose, Bld: 95 mg/dL (ref 70–99)
Potassium: 3.5 mEq/L — ABNORMAL LOW (ref 3.7–5.3)
Sodium: 138 mEq/L (ref 137–147)
Total Bilirubin: 0.5 mg/dL (ref 0.3–1.2)
Total Protein: 7.1 g/dL (ref 6.0–8.3)

## 2013-07-17 LAB — PREGNANCY, URINE: Preg Test, Ur: NEGATIVE

## 2013-07-17 MED ORDER — IOHEXOL 300 MG/ML  SOLN: 50.0000 mL | Freq: Once | INTRAMUSCULAR | Status: AC | PRN

## 2013-07-17 MED ORDER — MORPHINE SULFATE 4 MG/ML IJ SOLN
6.0000 mg | Freq: Once | INTRAMUSCULAR | Status: AC
  Administered 2013-07-17: 6 mg via INTRAVENOUS
  Filled 2013-07-17: qty 2

## 2013-07-17 MED ORDER — ONDANSETRON HCL 4 MG/2ML IJ SOLN
4.0000 mg | Freq: Once | INTRAMUSCULAR | Status: AC
  Administered 2013-07-17: 4 mg via INTRAVENOUS
  Filled 2013-07-17: qty 2

## 2013-07-17 MED ORDER — SODIUM CHLORIDE 0.9 % IV BOLUS (SEPSIS)
1000.0000 mL | Freq: Once | INTRAVENOUS | Status: AC
  Administered 2013-07-17: 1000 mL via INTRAVENOUS

## 2013-07-17 NOTE — ED Notes (Signed)
Pt arrived to the ED with a complaint of abdominal pain that has been persistent for three weeks.  Pt states the pain located left lower that radiates to the left flank.  Pt is also stating that she feels weak and faint.

## 2013-07-18 ENCOUNTER — Encounter (HOSPITAL_COMMUNITY): Payer: Self-pay

## 2013-07-18 ENCOUNTER — Emergency Department (HOSPITAL_COMMUNITY): Payer: BC Managed Care – PPO

## 2013-07-18 LAB — URINALYSIS, ROUTINE W REFLEX MICROSCOPIC
Bilirubin Urine: NEGATIVE
Glucose, UA: NEGATIVE mg/dL
Hgb urine dipstick: NEGATIVE
Ketones, ur: 40 mg/dL — AB
Nitrite: NEGATIVE
Protein, ur: NEGATIVE mg/dL
Specific Gravity, Urine: 1.025 (ref 1.005–1.030)
Urobilinogen, UA: 1 mg/dL (ref 0.0–1.0)
pH: 8 (ref 5.0–8.0)

## 2013-07-18 LAB — URINE MICROSCOPIC-ADD ON

## 2013-07-18 LAB — LIPASE, BLOOD: Lipase: 34 U/L (ref 11–59)

## 2013-07-18 MED ORDER — METRONIDAZOLE 500 MG PO TABS: 500.0000 mg | ORAL_TABLET | Freq: Two times a day (BID) | ORAL | Status: DC

## 2013-07-18 MED ORDER — CIPROFLOXACIN HCL 500 MG PO TABS
500.0000 mg | ORAL_TABLET | Freq: Once | ORAL | Status: AC
  Administered 2013-07-18: 500 mg via ORAL
  Filled 2013-07-18: qty 1

## 2013-07-18 MED ORDER — HYDROCODONE-ACETAMINOPHEN 5-325 MG PO TABS: 1.0000 | ORAL_TABLET | Freq: Four times a day (QID) | ORAL | Status: DC | PRN

## 2013-07-18 MED ORDER — CIPROFLOXACIN HCL 500 MG PO TABS: 500.0000 mg | ORAL_TABLET | Freq: Two times a day (BID) | ORAL | Status: DC

## 2013-07-18 MED ORDER — IOHEXOL 300 MG/ML  SOLN
100.0000 mL | Freq: Once | INTRAMUSCULAR | Status: AC | PRN
  Administered 2013-07-18: 100 mL via INTRAVENOUS

## 2013-07-18 MED ORDER — METRONIDAZOLE 500 MG PO TABS
500.0000 mg | ORAL_TABLET | Freq: Once | ORAL | Status: AC
  Administered 2013-07-18: 500 mg via ORAL
  Filled 2013-07-18: qty 1

## 2013-07-18 MED ORDER — ONDANSETRON HCL 4 MG/2ML IJ SOLN
4.0000 mg | Freq: Once | INTRAMUSCULAR | Status: AC
  Administered 2013-07-18: 4 mg via INTRAVENOUS
  Filled 2013-07-18: qty 2

## 2013-07-18 MED ORDER — IOHEXOL 300 MG/ML  SOLN
50.0000 mL | Freq: Once | INTRAMUSCULAR | Status: AC | PRN
  Administered 2013-07-18: 50 mL via ORAL

## 2013-07-18 MED ORDER — ONDANSETRON 4 MG PO TBDP: 4.0000 mg | ORAL_TABLET | Freq: Three times a day (TID) | ORAL | Status: DC | PRN

## 2013-07-18 NOTE — ED Provider Notes (Signed)
CT scan reviewed, showing diverticulitis.  Patient has been started on Cipro and Flagyl.  She's been given Vicodin for pain control, and referral to [facility] for followup  Garald Balding, NP 07/18/13 [phone]

## 2013-07-18 NOTE — ED Provider Notes (Signed)
Medical screening examination/treatment/procedure(s) were performed by non-physician practitioner and as supervising physician I was immediately available for consultation/collaboration.   EKG Interpretation None       Kalman Drape, MD 07/18/13 [phone]

## 2013-07-18 NOTE — ED Provider Notes (Signed)
CSN: [id number]     Arrival date & time 07/17/13  2119 History   First MD Initiated Contact with Patient 07/17/13 2220     Chief Complaint  Patient presents with  . Abdominal Pain     (Consider location/radiation/quality/duration/timing/severity/associated sxs/prior Treatment) HPI Patient presents to the emergency department with abdominal pain, that began 3 weeks ago, but is progressively gotten worse since that time.  Patient, states she is mostly having left-sided abdominal pain, but does feel some discomfort on the left flank as well.  Patient denies chest pain, shortness of breath, fatigue, weakness, dizziness, headache, blurred vision, back pain, vomiting, diarrhea, dysuria, hematuria, bloody stool, rash, vaginal bleeding, vaginal discharge, or syncope.  The patient states, that nothing seems to make her condition, better, but palpation makes her pain, worse.  Patient did not take any medications prior to arrival other than 1 Ultram 3 hours before arrival without relief of her symptoms. Past Medical History  Diagnosis Date  . GERD (gastroesophageal reflux disease)   . Hx of varicella   . Anemia     microcytic hypochromic anemia  . Hydronephrosis   . Migraine    Past Surgical History  Procedure Laterality Date  . No past surgeries     Family History  Problem Relation Age of Onset  . Lung cancer Father     non smoker raised with ets   . Diabetes Maternal Aunt   . Anemia Maternal Aunt   . Diabetes Maternal Uncle   . Diabetes Maternal Grandmother   . Diabetes Maternal Grandfather   . Stroke Maternal Grandfather   . Heart disease Maternal Grandfather   . Diabetes Paternal Grandmother   . Lung cancer Paternal Grandfather     lung  . Cancer - Lung Cousin   . Anemia Sister   . Thyroid disease Sister     ra1 ablation?   History  Substance Use Topics  . Smoking status: Never Smoker   . Smokeless tobacco: Never Used  . Alcohol Use: Yes     Comment: Occasional glass of wine    OB History   Grav Para Term Preterm Abortions TAB SAB Ect Mult Living   2 2 2  0 0 0 0 0 0 2     Review of Systems  All other systems negative except as documented in the HPI. All pertinent positives and negatives as reviewed in the HPI.  Allergies  Review of patient's allergies indicates no known allergies.  Home Medications   Current Outpatient Rx  Name  Route  Sig  Dispense  Refill  . levonorgestrel (MIRENA) 20 MCG/24HR IUD   Intrauterine   1 each by Intrauterine route once.         . traMADol (ULTRAM) 50 MG tablet   Oral   Take 50 mg by mouth once.          BP 130/72  Pulse 99  Temp(Src) 98.2 F (36.8 C) (Oral)  Resp 16  SpO2 99%  Breastfeeding? No Physical Exam  Nursing note and vitals reviewed. Constitutional: She is oriented to person, place, and time. She appears well-developed and well-nourished. No distress.  HENT:  Head: Normocephalic and atraumatic.  Mouth/Throat: Oropharynx is clear and moist.  Eyes: Pupils are equal, round, and reactive to light.  Neck: Normal range of motion. Neck supple.  Cardiovascular: Normal rate, regular rhythm and normal heart sounds.  Exam reveals no gallop and no friction rub.   No murmur heard. Pulmonary/Chest: Effort normal and breath sounds  normal. No respiratory distress.  Abdominal: Soft. Normal appearance and bowel sounds are normal. There is tenderness in the right upper quadrant, right lower quadrant, left upper quadrant and left lower quadrant. There is no rigidity, no rebound and no guarding.  Musculoskeletal: She exhibits no edema.  Neurological: She is alert and oriented to person, place, and time. She exhibits normal muscle tone. Coordination normal.  Skin: Skin is warm and dry.    ED Course  Procedures (including critical care time) Labs Review Labs Reviewed  CBC WITH DIFFERENTIAL - Abnormal; Notable for the following:    Neutrophils Relative % 82 (*)    Neutro Abs 7.8 (*)    Lymphocytes Relative 11  (*)    All other components within normal limits  COMPREHENSIVE METABOLIC PANEL - Abnormal; Notable for the following:    Potassium 3.5 (*)    All other components within normal limits  URINALYSIS, ROUTINE W REFLEX MICROSCOPIC - Abnormal; Notable for the following:    APPearance CLOUDY (*)    Ketones, ur 40 (*)    Leukocytes, UA TRACE (*)    All other components within normal limits  URINE MICROSCOPIC-ADD ON - Abnormal; Notable for the following:    Squamous Epithelial / LPF MANY (*)    Bacteria, UA FEW (*)    All other components within normal limits  PREGNANCY, URINE     Patient was signed out to Junius Creamer NP for completion of her work up. Detailed report was given to on coming provider.  Brent General, PA-C 07/20/13 1434

## 2013-07-18 NOTE — ED Notes (Signed)
Patient transported to CT 

## 2013-07-18 NOTE — Discharge Instructions (Signed)
Diverticulitis Small pockets or "bubbles" can develop in the wall of the intestine. Diverticulitis is when those pockets become infected and inflamed. This causes stomach pain (usually on the left side). HOME CARE  Take all medicine as told by your doctor.  Try a clear liquid diet (broth, tea, or water) for as long as told by your doctor.  Keep all follow-up visits with your doctor.  You may be put on a low-fiber diet once you start feeling better. Here are foods that have low-fiber:  White breads, cereals, rice, and pasta.  Cooked fruits and vegetables or soft fresh fruits and vegetables without the skin.  Ground or well-cooked tender beef, ham, veal, lamb, pork, or poultry.  Eggs and seafood.  After you are doing well on the low-fiber diet, you may be put on a high-fiber diet. Here are ways to increase your fiber:  Choose whole-grain breads, cereals, pasta, and brown rice.  Choose fruits and vegetables with skin on. Do not overcook the vegetables.  Choose nuts, seeds, legumes, dried peas, beans, and lentils.  Look for food products that have more than 3 grams of fiber per serving on the food label. GET HELP RIGHT AWAY IF:  Your pain does not get better or gets worse.  You have trouble eating food.  You are not pooping (having bowel movements) like normal.  You have a temperature by mouth above 102 F (38.9 C), not controlled by medicine.  You keep throwing up (vomiting).  You have bloody or black, tarry poop (stools).  You are getting worse and not better. MAKE SURE YOU:   Understand these instructions.  Will watch your condition.  Will get help right away if you are not doing well or get worse. Document Released: 10/15/2007 Document Revised: 07/21/2011 Document Reviewed: 03/19/2009 [facility] Patient Information 2014 [place], [place]. Please take the antibiotic as directed until completed  Make an appointment with [facility] for follow up care

## 2013-07-22 NOTE — ED Provider Notes (Signed)
Medical screening examination/treatment/procedure(s) were performed by non-physician practitioner and as supervising physician I was immediately available for consultation/collaboration.   EKG Interpretation None       Orlie Dakin, MD 07/22/13 1100

## 2013-09-10 ENCOUNTER — Encounter: Payer: Self-pay | Admitting: *Deleted

## 2013-11-02 ENCOUNTER — Ambulatory Visit (INDEPENDENT_AMBULATORY_CARE_PROVIDER_SITE_OTHER): Payer: BC Managed Care – PPO | Admitting: Internal Medicine

## 2013-11-02 ENCOUNTER — Encounter: Payer: Self-pay | Admitting: Internal Medicine

## 2013-11-02 VITALS — BP 136/96 | Temp 98.3°F | Ht 64.5 in | Wt 211.0 lb

## 2013-11-02 DIAGNOSIS — M79609 Pain in unspecified limb: Secondary | ICD-10-CM

## 2013-11-02 DIAGNOSIS — M79601 Pain in right arm: Secondary | ICD-10-CM

## 2013-11-02 DIAGNOSIS — R209 Unspecified disturbances of skin sensation: Secondary | ICD-10-CM

## 2013-11-02 DIAGNOSIS — R202 Paresthesia of skin: Secondary | ICD-10-CM

## 2013-11-02 DIAGNOSIS — R200 Anesthesia of skin: Secondary | ICD-10-CM

## 2013-11-02 MED ORDER — NAPROXEN 500 MG PO TABS: 500.0000 mg | ORAL_TABLET | Freq: Two times a day (BID) | ORAL | Status: DC

## 2013-11-02 NOTE — Patient Instructions (Signed)
This acts like  Overuse  Tendinitis  carpal tunnel  or  pinched nerve in neck area . Antiinflammatory  For about 2 weeks  . ROV in 2-3 weeks or so . unless better. And can call . ROV if worsening  in the meantime.

## 2013-11-02 NOTE — Progress Notes (Signed)
Pre visit review using our clinic review tool, if applicable. No additional management support is needed unless otherwise documented below in the visit note.  Chief Complaint  Patient presents with  . Arm Pain    Rt arm.  Has tingling and a "pulling sensation."      HPI: Patient comes in today for SDA for  new problem evaluation. Happened 2 night away and then resolved and then last night worse   Couldn't twist open the bottle  For her infant.  And not gone and right arm is not right   To middle finger.  Causes certain pain when certain movement . No hx trauma  Hx of numbness of finger middle finger in the past  Almost blue and went down over  Towel.  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   ROS: See pertinent positives and negatives per HPI.no cns sx rash  Past Medical History  Diagnosis Date  . GERD (gastroesophageal reflux disease)   . Hx of varicella   . Anemia     microcytic hypochromic anemia  . Hydronephrosis   . Migraine     Family History  Problem Relation Age of Onset  . Lung cancer Father     non smoker raised with ets   . Diabetes Maternal Aunt   . Anemia Maternal Aunt   . Diabetes Maternal Uncle   . Diabetes Maternal Grandmother   . Diabetes Maternal Grandfather   . Stroke Maternal Grandfather   . Heart disease Maternal Grandfather   . Diabetes Paternal Grandmother   . Lung cancer Paternal Grandfather     lung  . Cancer - Lung Cousin   . Anemia Sister   . Thyroid disease Sister     ra1 ablation?    History   Social History  . Marital Status: Single    Spouse Name: N/A    Number of Children: 2  . Years of Education: N/A   Occupational History  . [place] History Main Topics  . Smoking status: Never Smoker   . Smokeless tobacco: Never Used  . Alcohol Use: Yes     Comment: Occasional glass of wine  . Drug Use: No  . Sexual Activity: Yes    Birth Control/ Protection: None   Other Topics Concern  . None   Social  History Narrative   Averages 9 hours of sleep per night   6 people living in the home 2 young children    2 indoor dogs      [employer]    g2 p2 single     Outpatient Encounter Prescriptions as of 11/02/2013  Medication Sig  . levonorgestrel (MIRENA) 20 MCG/24HR IUD 1 each by Intrauterine route once.  . naproxen (NAPROSYN) 500 MG tablet Take 1 tablet (500 mg total) by mouth 2 (two) times daily with a meal. As directed  . [DISCONTINUED] ciprofloxacin (CIPRO) 500 MG tablet Take 1 tablet (500 mg total) by mouth 2 (two) times daily.  . [DISCONTINUED] HYDROcodone-acetaminophen (NORCO/VICODIN) 5-325 MG per tablet Take 1 tablet by mouth every 6 (six) hours as needed.  . [DISCONTINUED] metroNIDAZOLE (FLAGYL) 500 MG tablet Take 1 tablet (500 mg total) by mouth 2 (two) times daily.  . [DISCONTINUED] ondansetron (ZOFRAN ODT) 4 MG disintegrating tablet Take 1 tablet (4 mg total) by mouth every 8 (eight) hours as needed for nausea or vomiting.  . [DISCONTINUED] traMADol (ULTRAM) 50 MG tablet Take 50 mg by mouth  once.    EXAM:  BP 136/96  Temp(Src) 98.3 F (36.8 C) (Oral)  Ht 5' 4.5" (1.638 m)  Wt 211 lb (95.709 kg)  BMI 35.67 kg/m2  Body mass index is 35.67 kg/(m^2).  GENERAL: vitals reviewed and listed above, alert, oriented, appears well hydrated and in no acute distresslooks a bit uncomfortable right arm rubbing it  HEENT: atraumatic, conjunctiva  clear, no obvious abnormalities on inspection of external nose and ears  NECK: no obvious masses on inspection palpation non tender  LUNGS: clear to auscultation bilaterally, no wheezes, rales or rhonchi, good air movement CV: HRRR, no clubbing cyanosis or  peripheral edema nl cap refill  MS: moves all extremities without noticeable focal  Abnormality  Grip and strength good but right hand less than right( r dominant) nv intact at this time dtrs present ue le gait nl PSYCH: pleasant and cooperative, no obvious depression or  anxiety  ASSESSMENT AND PLAN:  Discussed the following assessment and plan:  Numbness and tingling of right arm - poss  compression  and ? overuse.  dominant hand  no other alrm features  plan antiinflamm therapy and relateve rest and fu if continues   Arm pain, right Can consider nocturnal wrist splinting also  -Patient advised to return or notify health care team  if symptoms worsen ,persist or new concerns arise.  Patient Instructions  This acts like  Overuse  Tendinitis  carpal tunnel  or  pinched nerve in neck area . Antiinflammatory  For about 2 weeks  . ROV in 2-3 weeks or so . unless better. And can call . ROV if worsening  in the meantime.       Standley Brooking. [place]osh M.D.

## 2013-11-03 DIAGNOSIS — R202 Paresthesia of skin: Secondary | ICD-10-CM | POA: Insufficient documentation

## 2013-11-03 DIAGNOSIS — M79601 Pain in right arm: Secondary | ICD-10-CM | POA: Insufficient documentation

## 2013-11-03 DIAGNOSIS — R200 Anesthesia of skin: Secondary | ICD-10-CM | POA: Insufficient documentation

## 2014-01-09 ENCOUNTER — Ambulatory Visit: Payer: BC Managed Care – PPO | Admitting: Internal Medicine

## 2014-01-17 ENCOUNTER — Encounter: Payer: Self-pay | Admitting: Internal Medicine

## 2014-01-17 ENCOUNTER — Ambulatory Visit (INDEPENDENT_AMBULATORY_CARE_PROVIDER_SITE_OTHER): Payer: BC Managed Care – PPO | Admitting: Internal Medicine

## 2014-01-17 VITALS — BP 124/78 | Temp 98.5°F | Ht 64.5 in | Wt 205.0 lb

## 2014-01-17 DIAGNOSIS — Z801 Family history of malignant neoplasm of trachea, bronchus and lung: Secondary | ICD-10-CM

## 2014-01-17 DIAGNOSIS — Z8349 Family history of other endocrine, nutritional and metabolic diseases: Secondary | ICD-10-CM

## 2014-01-17 DIAGNOSIS — Z23 Encounter for immunization: Secondary | ICD-10-CM

## 2014-01-17 DIAGNOSIS — R209 Unspecified disturbances of skin sensation: Secondary | ICD-10-CM

## 2014-01-17 DIAGNOSIS — R202 Paresthesia of skin: Secondary | ICD-10-CM

## 2014-01-17 DIAGNOSIS — M79601 Pain in right arm: Secondary | ICD-10-CM

## 2014-01-17 DIAGNOSIS — R200 Anesthesia of skin: Secondary | ICD-10-CM

## 2014-01-17 DIAGNOSIS — M79609 Pain in unspecified limb: Secondary | ICD-10-CM

## 2014-01-17 DIAGNOSIS — Z8342 Family history of familial hypercholesterolemia: Secondary | ICD-10-CM | POA: Insufficient documentation

## 2014-01-17 LAB — BASIC METABOLIC PANEL
BUN: 12 mg/dL (ref 6–23)
CO2: 23 mEq/L (ref 19–32)
Calcium: 9.1 mg/dL (ref 8.4–10.5)
Chloride: 107 mEq/L (ref 96–112)
Creatinine, Ser: 0.7 mg/dL (ref 0.4–1.2)
GFR: 126.28 mL/min (ref >60.00)
Glucose, Bld: 82 mg/dL (ref 70–99)
Potassium: 3.7 mEq/L (ref 3.5–5.1)
Sodium: 136 mEq/L (ref 135–145)

## 2014-01-17 LAB — LIPID PANEL
Cholesterol: 147 mg/dL (ref 0–200)
HDL: 41.3 mg/dL (ref >39.00)
LDL Cholesterol: 89 mg/dL (ref 0–99)
NonHDL: 105.7
Total CHOL/HDL Ratio: 4
Triglycerides: 83 mg/dL (ref 0.0–149.0)
VLDL: 16.6 mg/dL (ref 0.0–40.0)

## 2014-01-17 LAB — CBC WITH DIFFERENTIAL/PLATELET
Basophils Absolute: 0 10*3/uL (ref 0.0–0.1)
Basophils Relative: 0.6 % (ref 0.0–3.0)
Eosinophils Absolute: 0.1 10*3/uL (ref 0.0–0.7)
Eosinophils Relative: 1 % (ref 0.0–5.0)
HCT: 41.7 % (ref 36.0–46.0)
Hemoglobin: 14.2 g/dL (ref 12.0–15.0)
Lymphocytes Relative: 23.9 % (ref 12.0–46.0)
Lymphs Abs: 1.2 10*3/uL (ref 0.7–4.0)
MCHC: 34 g/dL (ref 30.0–36.0)
MCV: 79.8 fl (ref 78.0–100.0)
Monocytes Absolute: 0.3 10*3/uL (ref 0.1–1.0)
Monocytes Relative: 6.8 % (ref 3.0–12.0)
Neutro Abs: 3.4 10*3/uL (ref 1.4–7.7)
Neutrophils Relative %: 67.7 % (ref 43.0–77.0)
Platelets: 224 10*3/uL (ref 150.0–400.0)
RBC: 5.22 Mil/uL — ABNORMAL HIGH (ref 3.87–5.11)
RDW: 12.9 % (ref 11.5–15.5)
WBC: 5 10*3/uL (ref 4.0–10.5)

## 2014-01-17 LAB — TSH: TSH: 0.88 u[IU]/mL (ref 0.35–4.50)

## 2014-01-17 LAB — C-REACTIVE PROTEIN: CRP: <0.5 mg/dL (ref 0.5–20.0)

## 2014-01-17 LAB — T4, FREE: Free T4: 0.88 ng/dL (ref 0.60–1.60)

## 2014-01-17 NOTE — Progress Notes (Signed)
Pre visit review using our clinic review tool, if applicable. No additional management support is needed unless otherwise documented below in the visit note.  Chief Complaint  Patient presents with  . Wrist Pain    Still continues.  Comes and goes.  Travels from her wrist and into her arm.    HPI: Cindy Dodson comes in also with her 62 mos old wcc  For fu of numbnes s and pain in right UE.  Still sharp pains at times   And sometimes   Arm numb upper down .   Wrist  Last the whole dayt .  She believes the situation is getting worse. Has no numbness or pain in her other extremities.  Also brings up some questions about screening for cancers. A very strong family history of cancer lung cancer and nonsmokers including father grandfathers. Mom has a history of high cholesterol in the 300s. She had episodes of lightheadedness and dizziness a while back of her couple days but otherwise negative her mom said that she had some elevation of her blood pressure. Mom has hypertension. She asked these things while she is here for her followup visit because it's hard for her to come in because she works and goes to school. There is also strong family history of thyroid disease asks about this her sister and mother have thyroid disease.  ROS: See pertinent positives and negatives per HPI. No fever weight loss chest pain shortness of breath syncope. History of migraines. Father orthopedic surgeon and died from lung cancer  Past Medical History  Diagnosis Date  . GERD (gastroesophageal reflux disease)   . Hx of varicella   . Anemia     microcytic hypochromic anemia  . Hydronephrosis   . Migraine     Family History  Problem Relation Age of Onset  . Lung cancer Father     non smoker raised with ets   . Diabetes Maternal Aunt   . Anemia Maternal Aunt   . Diabetes Maternal Uncle   . Diabetes Maternal Grandmother   . Diabetes Maternal Grandfather   . Stroke Maternal Grandfather   . Heart  disease Maternal Grandfather   . Diabetes Paternal Grandmother   . Lung cancer Paternal Grandfather     lung  . Cancer - Lung Cousin   . Anemia Sister   . Thyroid disease Sister     ra1 ablation?    History   Social History  . Marital Status: Single    Spouse Name: N/A    Number of Children: 2  . Years of Education: N/A   Occupational History  . [place] History Main Topics  . Smoking status: Never Smoker   . Smokeless tobacco: Never Used  . Alcohol Use: Yes     Comment: Occasional glass of wine  . Drug Use: No  . Sexual Activity: Yes    Birth Control/ Protection: None   Other Topics Concern  . None   Social History Narrative   Averages 9 hours of sleep per night   6 people living in the home 2 young children    2 indoor dogs      [employer]    g2 p2 single     Outpatient Encounter Prescriptions as of 01/17/2014  Medication Sig  . levonorgestrel (MIRENA) 20 MCG/24HR IUD 1 each by Intrauterine route once.  . [DISCONTINUED] naproxen (NAPROSYN) 500 MG tablet Take 1 tablet (500 mg total) by mouth  2 (two) times daily with a meal. As directed    EXAM:  BP 124/78  Temp(Src) 98.5 F (36.9 C) (Oral)  Ht 5' 4.5" (1.638 m)  Wt 205 lb (92.987 kg)  BMI 34.66 kg/m2  Body mass index is 34.66 kg/(m^2).  GENERAL: vitals reviewed and listed above, alert, oriented, appears well hydrated and in no acute distress HEENT: atraumatic, conjunctiva  clear, no obvious abnormalities on inspection of external nose and ears NECK: no obvious masses on inspection palpation  LUNGS: clear to auscultation bilaterally, no wheezes, rales or rhonchi, good air movement CV: HRRR, no clubbing cyanosis or  peripheral edema nl cap refill  MS: moves all extremities without noticeable focal  abnormality no point tenderness grip strength seems normal points to the area of ventral wrist median the area pulses are intact normal capillary refill DTRs present and  opportunities PSYCH: pleasant and cooperative, no obvious depression or anxiety Lab Results  Component Value Date   WBC 9.5 07/17/2013   HGB 13.4 07/17/2013   HCT 39.0 07/17/2013   PLT 188 07/17/2013   GLUCOSE 95 07/17/2013   ALT 8 07/17/2013   AST 12 07/17/2013   NA 138 07/17/2013   K 3.5* 07/17/2013   CL 101 07/17/2013   CREATININE 0.85 07/17/2013   BUN 11 07/17/2013   CO2 24 07/17/2013   Wt Readings from Last 3 Encounters:  01/17/14 205 lb (92.987 kg)  11/02/13 211 lb (95.709 kg)  06/29/13 209 lb (94.802 kg)     ASSESSMENT AND PLAN:  Discussed the following assessment and plan:  Numbness and tingling of right arm - ? of cts other continueing progressing. worse at night. r/o thyroid contributor - Plan: TSH, T4, free, Basic metabolic panel, CBC with Differential, Lipid panel, C-reactive protein, Ambulatory referral to Hand Surgery  Arm pain, right - Plan: TSH, T4, free, Basic metabolic panel, CBC with Differential, Lipid panel, C-reactive protein, Ambulatory referral to Hand Surgery  Need for prophylactic vaccination and inoculation against influenza - Plan: Flu Vaccine QUAD 36+ mos PF IM (Fluarix Quad PF)  Family history of lung cancer - Plan: TSH, T4, free, Basic metabolic panel, CBC with Differential, Lipid panel, C-reactive protein  Family history of high cholesterol - Reported in the 300s mom on medicine reviewed and counseled a bout screening and sx of concerning conditions asked . At this time  Risk reduction . Labs today . Flu vaccine today  -Patient advised to return or notify health care team  if symptoms worsen ,persist or new concerns arise.  Patient Instructions  Suspect that you could have carpal tunnel compression and also  Some tendinitis  but since progressing we will arrange a referral for evaluation and treatment .   Will notify you  of labs when available. Checking for thyroid elevated blood sugars and anemia.   Standley Brooking. [place] Blizzard M.D.

## 2014-01-17 NOTE — Patient Instructions (Addendum)
Suspect that you could have carpal tunnel compression and also  Some tendinitis  but since progressing we will arrange a referral for evaluation and treatment .   Will notify you  of labs when available. Checking for thyroid elevated blood sugars and anemia.

## 2014-03-13 ENCOUNTER — Encounter: Payer: Self-pay | Admitting: Internal Medicine

## 2014-06-09 ENCOUNTER — Telehealth: Payer: Self-pay | Admitting: Internal Medicine

## 2014-06-09 ENCOUNTER — Encounter: Payer: Self-pay | Admitting: Family Medicine

## 2014-06-09 ENCOUNTER — Ambulatory Visit (INDEPENDENT_AMBULATORY_CARE_PROVIDER_SITE_OTHER): Payer: BLUE CROSS/BLUE SHIELD | Admitting: Family Medicine

## 2014-06-09 VITALS — BP 129/96 | HR 66 | Temp 98.3°F | Ht 64.5 in | Wt 209.0 lb

## 2014-06-09 DIAGNOSIS — R1013 Epigastric pain: Secondary | ICD-10-CM

## 2014-06-09 NOTE — Progress Notes (Signed)
Pre visit review using our clinic review tool, if applicable. No additional management support is needed unless otherwise documented below in the visit note. 

## 2014-06-09 NOTE — Progress Notes (Signed)
   Subjective:    Patient ID: Cindy Dodson, female    DOB: Dec 06, 1990, 24 y.o.   MRN: [id number]  HPI Here for 2 weeks of intermittent diarrhea, heartburn, and middle abdominal cramps. No fever. No urinary symptoms. She has an IUD in place. No vaginal bleeding or DC. She has intermittent neck and upper back pain, an this started flaring up about 4 weeks ago. She had no abdominal complaints at that point. She started taking Excedrin and Advil for the neck pain, and this went away about a week ago. Then she started having the abdominal sx as above. The diarrhea has stopped and she has had no BM at all for the past 3 days. She ahs been nauseated but has not vomited. Drinking fluids.    Review of Systems  Constitutional: Negative.   Respiratory: Negative.   Cardiovascular: Negative.   Gastrointestinal: Positive for nausea, abdominal pain and diarrhea. Negative for vomiting, constipation, blood in stool, abdominal distention, anal bleeding and rectal pain.  Genitourinary: Negative.   Neurological: Negative.        Objective:   Physical Exam  Constitutional: She appears well-developed and well-nourished. No distress.  Pulmonary/Chest: Effort normal and breath sounds normal. No respiratory distress. She has no wheezes. She has no rales.  Abdominal: Soft. Bowel sounds are normal. She exhibits no distension and no mass. There is no rebound and no guarding.  Mildly tender diffusely but more so in the epigastrium           Assessment & Plan:  This sounds like duodenitis or even an ulcer, possibly as a result of taking a lot of NSAIDs recently. She has stopped taking all NSAIDs for now. Start on Prilosec OTC 20 mg bid. Set up an [place] to evaluate the gall bladder.

## 2014-06-09 NOTE — Telephone Encounter (Signed)
Noted.  Nurse to call pt back.

## 2014-06-09 NOTE — Telephone Encounter (Signed)
[facility] - Client [place] [facility]  Patient Name: Cindy Dodson  DOB: 12-08-1990    Initial Comment Caller states c/o abdominal cramping, constipation, nausea, irritable   Nurse Assessment  Nurse: Wynetta Emery, RN, [place] Date/Time (Eastern Time): 06/09/2014 9:23:41 AM  Confirm and document reason for call. If symptomatic, describe symptoms. ---[name] had diarrhea for over one week stopped -- then constipated --- then passed a tarry black stool x 1 (passed 3 days ago) -- and now can't pass any stools but needs to go abd cramping nauseated and headache with pain in the neck  Has the patient traveled out of the country within the last 30 days? ---No  Does the patient require triage? ---Yes  Related visit to physician within the last 2 weeks? ---No  Does the PT have any chronic conditions? (i.e. diabetes, asthma, etc.) ---No  Did the patient indicate they were pregnant? ---No     Guidelines    Guideline Title Affirmed Question Affirmed Notes  Constipation Abdomen is more swollen than usual    Final Disposition User   See Physician within 24 Hours Wynetta Emery, RN, [place] states she missed nurses cb - nurse notified

## 2014-06-12 ENCOUNTER — Encounter (HOSPITAL_COMMUNITY): Payer: Self-pay | Admitting: Neurology

## 2014-06-12 ENCOUNTER — Emergency Department (HOSPITAL_COMMUNITY): Payer: BLUE CROSS/BLUE SHIELD

## 2014-06-12 ENCOUNTER — Emergency Department (HOSPITAL_COMMUNITY)
Admission: EM | Admit: 2014-06-12 | Discharge: 2014-06-12 | Disposition: A | Discharge status: Home / Self Care | Payer: BLUE CROSS/BLUE SHIELD | Attending: Emergency Medicine | Admitting: Emergency Medicine

## 2014-06-12 DIAGNOSIS — Z8719 Personal history of other diseases of the digestive system: Secondary | ICD-10-CM | POA: Insufficient documentation

## 2014-06-12 DIAGNOSIS — Z3202 Encounter for pregnancy test, result negative: Secondary | ICD-10-CM | POA: Diagnosis not present

## 2014-06-12 DIAGNOSIS — Z8679 Personal history of other diseases of the circulatory system: Secondary | ICD-10-CM | POA: Diagnosis not present

## 2014-06-12 DIAGNOSIS — Z862 Personal history of diseases of the blood and blood-forming organs and certain disorders involving the immune mechanism: Secondary | ICD-10-CM | POA: Diagnosis not present

## 2014-06-12 DIAGNOSIS — J029 Acute pharyngitis, unspecified: Secondary | ICD-10-CM | POA: Insufficient documentation

## 2014-06-12 DIAGNOSIS — R109 Unspecified abdominal pain: Secondary | ICD-10-CM

## 2014-06-12 DIAGNOSIS — Z8619 Personal history of other infectious and parasitic diseases: Secondary | ICD-10-CM | POA: Insufficient documentation

## 2014-06-12 DIAGNOSIS — M542 Cervicalgia: Secondary | ICD-10-CM | POA: Diagnosis not present

## 2014-06-12 DIAGNOSIS — R112 Nausea with vomiting, unspecified: Secondary | ICD-10-CM | POA: Diagnosis not present

## 2014-06-12 DIAGNOSIS — R1030 Lower abdominal pain, unspecified: Secondary | ICD-10-CM | POA: Insufficient documentation

## 2014-06-12 DIAGNOSIS — Z87448 Personal history of other diseases of urinary system: Secondary | ICD-10-CM | POA: Diagnosis not present

## 2014-06-12 DIAGNOSIS — R1110 Vomiting, unspecified: Secondary | ICD-10-CM

## 2014-06-12 DIAGNOSIS — R059 Cough, unspecified: Secondary | ICD-10-CM

## 2014-06-12 DIAGNOSIS — R05 Cough: Secondary | ICD-10-CM

## 2014-06-12 LAB — GRAM STAIN

## 2014-06-12 LAB — COMPREHENSIVE METABOLIC PANEL
ALT: 11 U/L (ref 0–35)
AST: 18 U/L (ref 0–37)
Albumin: 3.8 g/dL (ref 3.5–5.2)
Alkaline Phosphatase: 36 U/L — ABNORMAL LOW (ref 39–117)
Anion gap: 8 (ref 5–15)
BUN: 9 mg/dL (ref 6–23)
CO2: 23 mmol/L (ref 19–32)
Calcium: 8.7 mg/dL (ref 8.4–10.5)
Chloride: 106 mmol/L (ref 96–112)
Creatinine, Ser: 0.78 mg/dL (ref 0.50–1.10)
GFR calc Af Amer: >90 mL/min (ref >90)
GFR calc non Af Amer: >90 mL/min (ref >90)
Glucose, Bld: 105 mg/dL — ABNORMAL HIGH (ref 70–99)
Potassium: 3.2 mmol/L — ABNORMAL LOW (ref 3.5–5.1)
Sodium: 137 mmol/L (ref 135–145)
Total Bilirubin: 0.5 mg/dL (ref 0.3–1.2)
Total Protein: 6.4 g/dL (ref 6.0–8.3)

## 2014-06-12 LAB — CSF CELL COUNT WITH DIFFERENTIAL
RBC Count, CSF: 208 /mm3 — ABNORMAL HIGH
RBC Count, CSF: 6 /mm3 — ABNORMAL HIGH
Tube #: 1
Tube #: 4
WBC, CSF: 1 /mm3 (ref 0–5)
WBC, CSF: 1 /mm3 (ref 0–5)

## 2014-06-12 LAB — URINALYSIS, ROUTINE W REFLEX MICROSCOPIC
Bilirubin Urine: NEGATIVE
Glucose, UA: NEGATIVE mg/dL
Hgb urine dipstick: NEGATIVE
Ketones, ur: NEGATIVE mg/dL
Leukocytes, UA: NEGATIVE
Nitrite: NEGATIVE
Protein, ur: NEGATIVE mg/dL
Specific Gravity, Urine: 1.019 (ref 1.005–1.030)
Urobilinogen, UA: 0.2 mg/dL (ref 0.0–1.0)
pH: 6 (ref 5.0–8.0)

## 2014-06-12 LAB — CBC WITH DIFFERENTIAL/PLATELET
Basophils Absolute: 0 10*3/uL (ref 0.0–0.1)
Basophils Relative: 0 % (ref 0–1)
Eosinophils Absolute: 0.1 10*3/uL (ref 0.0–0.7)
Eosinophils Relative: 1 % (ref 0–5)
HCT: 39.5 % (ref 36.0–46.0)
Hemoglobin: 13.6 g/dL (ref 12.0–15.0)
Lymphocytes Relative: 17 % (ref 12–46)
Lymphs Abs: 1 10*3/uL (ref 0.7–4.0)
MCH: 26.7 pg (ref 26.0–34.0)
MCHC: 34.4 g/dL (ref 30.0–36.0)
MCV: 77.5 fL — ABNORMAL LOW (ref 78.0–100.0)
Monocytes Absolute: 0.4 10*3/uL (ref 0.1–1.0)
Monocytes Relative: 6 % (ref 3–12)
Neutro Abs: 4.5 10*3/uL (ref 1.7–7.7)
Neutrophils Relative %: 76 % (ref 43–77)
Platelets: 177 10*3/uL (ref 150–400)
RBC: 5.1 MIL/uL (ref 3.87–5.11)
RDW: 12.2 % (ref 11.5–15.5)
WBC: 6 10*3/uL (ref 4.0–10.5)

## 2014-06-12 LAB — PROTEIN AND GLUCOSE, CSF
Glucose, CSF: 50 mg/dL (ref 43–76)
Total  Protein, CSF: 20 mg/dL (ref 15–45)

## 2014-06-12 LAB — LIPASE, BLOOD: Lipase: 29 U/L (ref 11–59)

## 2014-06-12 LAB — POC URINE PREG, ED: Preg Test, Ur: NEGATIVE

## 2014-06-12 MED ORDER — LORAZEPAM 2 MG/ML IJ SOLN
1.0000 mg | Freq: Once | INTRAMUSCULAR | Status: AC
  Administered 2014-06-12: 1 mg via INTRAVENOUS
  Filled 2014-06-12: qty 1

## 2014-06-12 MED ORDER — LIDOCAINE-EPINEPHRINE (PF) 2 %-1:200000 IJ SOLN
10.0000 mL | Freq: Once | INTRAMUSCULAR | Status: AC
  Administered 2014-06-12: 10 mL
  Filled 2014-06-12: qty 20

## 2014-06-12 MED ORDER — METOCLOPRAMIDE HCL 5 MG/ML IJ SOLN
10.0000 mg | Freq: Once | INTRAMUSCULAR | Status: AC
  Administered 2014-06-12: 10 mg via INTRAVENOUS
  Filled 2014-06-12: qty 2

## 2014-06-12 MED ORDER — ONDANSETRON HCL 4 MG/2ML IJ SOLN
4.0000 mg | Freq: Once | INTRAMUSCULAR | Status: AC
  Administered 2014-06-12: 4 mg via INTRAVENOUS
  Filled 2014-06-12: qty 2

## 2014-06-12 MED ORDER — MORPHINE SULFATE 4 MG/ML IJ SOLN
4.0000 mg | Freq: Once | INTRAMUSCULAR | Status: AC
  Administered 2014-06-12: 4 mg via INTRAVENOUS
  Filled 2014-06-12: qty 1

## 2014-06-12 MED ORDER — SODIUM CHLORIDE 0.9 % IV BOLUS (SEPSIS)
1000.0000 mL | Freq: Once | INTRAVENOUS | Status: AC
  Administered 2014-06-12: 1000 mL via INTRAVENOUS

## 2014-06-12 MED ORDER — CYCLOBENZAPRINE HCL 10 MG PO TABS: 10.0000 mg | ORAL_TABLET | Freq: Two times a day (BID) | ORAL | Status: DC | PRN

## 2014-06-12 MED ORDER — DIPHENHYDRAMINE HCL 50 MG/ML IJ SOLN
25.0000 mg | Freq: Once | INTRAMUSCULAR | Status: AC
  Administered 2014-06-12: 25 mg via INTRAVENOUS
  Filled 2014-06-12: qty 1

## 2014-06-12 MED ORDER — PROMETHAZINE HCL 25 MG PO TABS: 25.0000 mg | ORAL_TABLET | Freq: Four times a day (QID) | ORAL | Status: DC | PRN

## 2014-06-12 NOTE — ED Notes (Signed)
Pt reports recent abdominal pain n/v, was told gallbladder problem. Today left sided neck pain, n/v.

## 2014-06-12 NOTE — ED Provider Notes (Addendum)
CSN: [id number]     Arrival date & time 06/12/14  0731 History   First MD Initiated Contact with Patient 06/12/14 [phone]     Chief Complaint  Patient presents with  . Neck Pain  . Nausea  . Emesis     (Consider location/radiation/quality/duration/timing/severity/associated sxs/prior Treatment) HPI Comments: Patient is a 24 year old female with no significant past medical history who for the last 2 weeks states she has felt sick. She says initially it started with epigastric abdominal pain, nausea, occasional vomiting and a week of profuse diarrhea. Also a proximally 2 weeks ago she started to develop right-sided neck pain. She saw her doctor approximately one week ago and she was feeling bad and they were concerned that she may have gallbladder problems. She has an ultrasound scheduled for February 8. However since that time in the last week the diarrhea has stopped and she now has constipation, her neck pain moved to the left side and now is diffuse 10 out of 10 worse with movement of her head. Yesterday she developed a productive cough with this and has noted some shortness of breath over the last 2 weeks with climbing stairs. Also yesterday she developed a sore throat.  She denies fever at any time. Currently she denies abdominal pain. She has never had chest pain. She is actively vomiting on exam.  Patient is a 24 y.o. female presenting with neck pain and vomiting. The history is provided by the patient.  Neck Pain Pain location:  Generalized neck Quality:  Stabbing and shooting Pain radiates to:  Does not radiate Pain severity:  Severe Pain is:  Same all the time Onset quality:  Gradual Duration:  2 weeks Timing:  Intermittent Progression:  Worsening Chronicity:  New Associated symptoms: no chest pain, no fever and no headaches   Emesis Associated symptoms: abdominal pain and sore throat   Associated symptoms: no chills and no headaches     Past Medical History  Diagnosis Date  .  GERD (gastroesophageal reflux disease)   . Hx of varicella   . Anemia     microcytic hypochromic anemia  . Hydronephrosis   . Migraine    Past Surgical History  Procedure Laterality Date  . No past surgeries     Family History  Problem Relation Age of Onset  . Lung cancer Father     non smoker raised with ets   . Diabetes Maternal Aunt   . Anemia Maternal Aunt   . Diabetes Maternal Uncle   . Diabetes Maternal Grandmother   . Diabetes Maternal Grandfather   . Stroke Maternal Grandfather   . Heart disease Maternal Grandfather   . Diabetes Paternal Grandmother   . Lung cancer Paternal Grandfather     lung  . Cancer - Lung Cousin   . Anemia Sister   . Thyroid disease Sister     ra1 ablation?   History  Substance Use Topics  . Smoking status: Never Smoker   . Smokeless tobacco: Never Used  . Alcohol Use: 0.0 oz/week    0 Not specified per week     Comment: Occasional glass of wine   OB History    Gravida Para Term Preterm AB TAB SAB Ectopic Multiple Living   2 2 2  0 0 0 0 0 0 2     Review of Systems  Constitutional: Negative for fever and chills.  HENT: Positive for sore throat.   Respiratory: Positive for cough and shortness of breath.  Cough 2 days ago.  Noticed some mild SOB for the last 2 weeks intermittently mostly when going up stairs  Cardiovascular: Negative for chest pain, palpitations and leg swelling.  Gastrointestinal: Positive for vomiting and abdominal pain.       Intermittent abd pain for the last 2 weeks with intermittent vomiting and diarrhea for 1 week that then stopped and changed to constipation in the last week  Genitourinary: Negative for dysuria, flank pain, vaginal bleeding and vaginal discharge.  Musculoskeletal: Positive for neck pain.  Neurological: Negative for headaches.  All other systems reviewed and are negative.     Allergies  Review of patient's allergies indicates no known allergies.  Home Medications   Prior to  Admission medications   Medication Sig Start Date End Date Taking? Authorizing Provider  levonorgestrel (MIRENA) 20 MCG/24HR IUD 1 each by Intrauterine route once.    Sheronette A Cousins, MD   BP 136/94 mmHg  Pulse 79  Temp(Src) 98.9 F (37.2 C) (Oral)  Resp 16  SpO2 98%  LMP  Physical Exam  Constitutional: She is oriented to person, place, and time. She appears well-developed and well-nourished. No distress.  HENT:  Head: Normocephalic and atraumatic.  Right Ear: Tympanic membrane and ear canal normal.  Left Ear: Tympanic membrane and ear canal normal.  Mouth/Throat: Mucous membranes are dry. Posterior oropharyngeal erythema present. No oropharyngeal exudate.  Eyes: Conjunctivae and EOM are normal. Pupils are equal, round, and reactive to light.  Neck: Normal range of motion. Neck supple. Spinous process tenderness and muscular tenderness present. No Brudzinski's sign and no Kernig's sign noted.  Cardiovascular: Normal rate, regular rhythm and intact distal pulses.   No murmur heard. Pulmonary/Chest: Effort normal and breath sounds normal. No respiratory distress. She has no wheezes. She has no rales.  Abdominal: Soft. Normal appearance. She exhibits no distension. There is tenderness in the suprapubic area. There is no rebound, no guarding, no CVA tenderness and negative Murphy's sign.  Musculoskeletal: Normal range of motion. She exhibits no edema or tenderness.  Lymphadenopathy:    She has no cervical adenopathy.  Neurological: She is alert and oriented to person, place, and time.  Skin: Skin is warm and dry. No rash noted. No erythema. There is pallor.  Psychiatric: She has a normal mood and affect. Her behavior is normal.  Nursing note and vitals reviewed.   ED Course  LUMBAR PUNCTURE Date/Time: 06/12/2014 11:32 AM Performed by: Blanchie Dessert Authorized by: Blanchie Dessert Consent: Verbal consent obtained. Written consent obtained. Risks and benefits: risks, benefits  and alternatives were discussed Consent given by: patient Patient understanding: patient states understanding of the procedure being performed Patient consent: the patient's understanding of the procedure matches consent given Procedure consent: procedure consent matches procedure scheduled Relevant documents: relevant documents present and verified Test results: test results available and properly labeled Site marked: the operative site was marked Patient identity confirmed: verbally with patient and arm band Time out: Immediately prior to procedure a "time out" was called to verify the correct patient, procedure, equipment, support staff and site/side marked as required. Indications: evaluation for infection Anesthesia: local infiltration Local anesthetic: lidocaine 2% with epinephrine Anesthetic total: 5 ml Patient sedated: no Preparation: Patient was prepped and draped in the usual sterile fashion. Lumbar space: L4-L5 interspace Patient's position: sitting Needle gauge: 18 Needle type: diamond point Needle length: 5.0 in Number of attempts: 1 Fluid appearance: clear Tubes of fluid: 4 Total volume: 4 ml Post-procedure: site cleaned, pressure dressing applied and  adhesive bandage applied Patient tolerance: Patient tolerated the procedure well with no immediate complications   (including critical care time) Labs Review Labs Reviewed  CBC WITH DIFFERENTIAL/PLATELET - Abnormal; Notable for the following:    MCV 77.5 (*)    All other components within normal limits  COMPREHENSIVE METABOLIC PANEL - Abnormal; Notable for the following:    Potassium 3.2 (*)    Glucose, Bld 105 (*)    Alkaline Phosphatase 36 (*)    All other components within normal limits  CSF CELL COUNT WITH DIFFERENTIAL - Abnormal; Notable for the following:    RBC Count, CSF 208 (*)    All other components within normal limits  CSF CELL COUNT WITH DIFFERENTIAL - Abnormal; Notable for the following:    RBC  Count, CSF 6 (*)    All other components within normal limits  GRAM STAIN  CSF CULTURE  LIPASE, BLOOD  URINALYSIS, ROUTINE W REFLEX MICROSCOPIC  PROTEIN AND GLUCOSE, CSF  POC URINE PREG, ED    Imaging Review Dg Chest 2 View  06/12/2014   CLINICAL DATA:  Cough and neck pain.  EXAM: CHEST  2 VIEW  COMPARISON:  None.  FINDINGS: Normal heart size and mediastinal contours. No acute infiltrate or edema. No effusion or pneumothorax. No acute osseous findings.  IMPRESSION: Negative chest.   Electronically Signed   By: Jorje Guild M.D.   On: 06/12/2014 08:21   US Abdomen Complete  06/12/2014   CLINICAL DATA:  Abdominal pain.  EXAM: ULTRASOUND ABDOMEN COMPLETE  COMPARISON:  None.  FINDINGS: Gallbladder: No gallstones or wall thickening visualized. No sonographic Murphy sign noted.  Common bile duct: Diameter: 0.3 cm  Liver: No focal lesion identified. Within normal limits in parenchymal echogenicity.  IVC: No abnormality visualized.  Pancreas: Visualized portion unremarkable.  Spleen: Size and appearance within normal limits.  Right Kidney: Length: 11.9 cm. Echogenicity within normal limits. No mass or hydronephrosis visualized.  Left Kidney: Length: 10.6 cm. Echogenicity within normal limits. No mass or hydronephrosis visualized.  Abdominal aorta: No aneurysm visualized.  Other findings: None.  IMPRESSION: Negative for gallstones.  Negative exam.   Electronically Signed   By: Inge Rise M.D.   On: 06/12/2014 09:02     EKG Interpretation None      MDM   Final diagnoses:  Abdominal pain  Cough  Intractable vomiting with nausea, vomiting of unspecified type  Neck pain    Patient with multiple vague complaints over the last 2 weeks without significant medical history. Concern for possible gallbladder pathology as the cause of her symptoms versus a viral etiology with the cough, sore throat, vomiting and diarrhea. Patient does have some minimal suprapubic pain currently so also concern for  pyelonephritis as patient does have a history of hydronephrosis in the past. She is complaining of severe neck pain that's progressed over the last 2 weeks which could be from muscle strain from intermittent vomiting however doubt meningitis as symptoms of been 2 weeks and patient has never had a fever and no altered mental status.  Lower suspicion for PNA  (no smoking or asthma hx)  8:15 AM CBC, CMP, lipase, UA/UPT, abd u/s and CXR pending.  11:30 AM Labs and imaging neg.  Will get LP as pt still has nausea and neck pain.  12:48 PM LP results normal. Unclear why vomiting and general malaise but most likely viral in origin.  Blanchie Dessert, MD 06/12/14 1248  Blanchie Dessert, MD 06/12/14 1311

## 2014-06-12 NOTE — ED Notes (Signed)
Patient transported to X-ray 

## 2014-06-14 ENCOUNTER — Encounter: Payer: Self-pay | Admitting: Internal Medicine

## 2014-06-14 NOTE — Progress Notes (Signed)
Document opened and reviewed for OV but appt  canceled same day .  

## 2014-06-15 LAB — CSF CULTURE W GRAM STAIN
Culture: NO GROWTH
Gram Stain: NONE SEEN

## 2014-06-19 ENCOUNTER — Other Ambulatory Visit: Payer: BLUE CROSS/BLUE SHIELD

## 2016-10-07 ENCOUNTER — Encounter: Payer: Self-pay | Admitting: Internal Medicine

## 2016-10-07 ENCOUNTER — Ambulatory Visit (INDEPENDENT_AMBULATORY_CARE_PROVIDER_SITE_OTHER): Payer: BLUE CROSS/BLUE SHIELD | Admitting: Internal Medicine

## 2016-10-07 VITALS — BP 140/100 | HR 87 | Temp 98.5°F | Ht 64.5 in | Wt 180.5 lb

## 2016-10-07 DIAGNOSIS — R21 Rash and other nonspecific skin eruption: Secondary | ICD-10-CM | POA: Diagnosis not present

## 2016-10-07 DIAGNOSIS — B029 Zoster without complications: Secondary | ICD-10-CM

## 2016-10-07 MED ORDER — VALACYCLOVIR HCL 1 G PO TABS: 1000.0000 mg | ORAL_TABLET | Freq: Three times a day (TID) | ORAL | 0 refills | Status: DC

## 2016-10-07 NOTE — Patient Instructions (Addendum)
  Pattern of the rash breaking out is suspicious for shingles or a herpetic type of infection. We will begin antiviral medicine over the next week in case this is viral or herpetic zoster. If they are bug bites you can use hydrocortisone cream   and Benadryl oral for itching  and give it time. It does not look like staph or strep infection at this time. However if you're getting recurrent see blisters fluid filled sacs and streaking spreading contact us for reevaluation.   I agreee with keeping rash covered  And avoid skin to skin incase   This is  Viral infection.   Rash may last 1-3 weeks  Either way .

## 2016-10-07 NOTE — Progress Notes (Signed)
Chief Complaint  Patient presents with  . Acute Visit    bug bites / spreading on the right leg     HPI: Cindy Dodson 26 y.o.  sda  Last seen 9 2015   Is generally well onset 2 days of ? Bug bites  Out a lot with the kids.  Right buttock kitch and beter and now  3 areas of blotches with bumps itchy but some tingling and stinging sensation right leg anteriro thigh.  No fever has mild cough . Doesn't remember if she had varicellla as a child . frind said get checked out. No rash on left side of body Chest CTA  Op clear ROS: See pertinent positives and negatives per HPI.  Past Medical History:  Diagnosis Date  . Anemia    microcytic hypochromic anemia  . GERD (gastroesophageal reflux disease)   . Hx of varicella   . Hydronephrosis   . Migraine     Family History  Problem Relation Age of Onset  . Lung cancer Father        non smoker raised with ets   . Diabetes Maternal Aunt   . Anemia Maternal Aunt   . Diabetes Maternal Uncle   . Diabetes Maternal Grandmother   . Diabetes Maternal Grandfather   . Stroke Maternal Grandfather   . Heart disease Maternal Grandfather   . Diabetes Paternal Grandmother   . Lung cancer Paternal Grandfather        lung  . Cancer - Lung Cousin   . Anemia Sister   . Thyroid disease Sister        ra1 ablation?    Social History   Social History  . Marital status: Single    Spouse name: N/A  . Number of children: 2  . Years of education: N/A   Occupational History  . [place] History Main Topics  . Smoking status: Never Smoker  . Smokeless tobacco: Never Used  . Alcohol use 0.0 oz/week     Comment: Occasional glass of wine  . Drug use: No  . Sexual activity: Yes    Birth control/ protection: None   Other Topics Concern  . 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 levonorgestrel (MIRENA) 20 MCG/24HR IUD 1 each by Intrauterine route once.    . cyclobenzaprine (FLEXERIL) 10 MG tablet Take 1 tablet (10 mg total) by mouth 2 (two) times daily as needed for muscle spasms. 20 tablet 0  . promethazine (PHENERGAN) 25 MG tablet Take 1 tablet (25 mg total) by mouth every 6 (six) hours as needed for nausea or vomiting. 30 tablet 0   No facility-administered medications prior to visit.      EXAM:  BP (!) 140/100 (BP Location: Right Arm, Patient Position: Sitting, Cuff Size: Normal)   Pulse 87   Temp 98.5 F (36.9 C) (Oral)   Ht 5' 4.5" (1.638 m)   Wt 180 lb 8 oz (81.9 kg)   BMI 30.50 kg/m   Body mass index is 30.5 kg/m.  GENERAL: vitals reviewed and listed above, alert, oriented, appears well hydrated and in no acute distress HEENT: atraumatic, conjunctiva  clear, no obvious abnormalities on inspection of external nose and ears  Skin right buttocks a 3 cm path of papules drying up.  And  Then 3 areas of blotchy redness without vesicles  But bump left ant thigh   PSYCH: pleasant and cooperative, no obvious depression or anxiety  ASSESSMENT AND PLAN:  Discussed the following assessment and plan:  Rash  Thigh shingles herpetic  possible  - see text. Uncertain cause of rash. Slowly appears to possibly be in the dermatome such as L3 buttocks rash and trailing down the leg she has minimal pain but some stinging and tingling when empirically treat for shingles or herpetic rash no obvious staph or strep infection I suppose it could be bug bites K she can just use topical anti-inflammatories. Expectant management follow-up as appropriate. Nothing really to culture today. -Patient advised to return or notify health care team  if symptoms worsen ,persist or new concerns arise.  Patient Instructions   Pattern of the rash breaking out is suspicious for shingles or a herpetic type  of infection. We will begin antiviral medicine over the next week in case this is viral or herpetic zoster. If they are bug bites you can use hydrocortisone cream   and Benadryl oral for itching  and give it time. It does not look like staph or strep infection at this time. However if you're getting recurrent see blisters fluid filled sacs and streaking spreading contact us for reevaluation.   I agreee with keeping rash covered  And avoid skin to skin incase   This is  Viral infection.   Rash may last 1-3 weeks  Either way .     Standley Brooking. Panosh M.D.

## 2017-01-30 ENCOUNTER — Encounter: Payer: Self-pay | Admitting: Internal Medicine

## 2017-02-23 ENCOUNTER — Ambulatory Visit (INDEPENDENT_AMBULATORY_CARE_PROVIDER_SITE_OTHER): Payer: BLUE CROSS/BLUE SHIELD | Admitting: Family Medicine

## 2017-02-23 ENCOUNTER — Encounter: Payer: Self-pay | Admitting: Family Medicine

## 2017-02-23 VITALS — BP 114/70 | HR 86 | Temp 98.4°F | Resp 12 | Ht 64.5 in | Wt 185.0 lb

## 2017-02-23 DIAGNOSIS — J069 Acute upper respiratory infection, unspecified: Secondary | ICD-10-CM | POA: Diagnosis not present

## 2017-02-23 DIAGNOSIS — J029 Acute pharyngitis, unspecified: Secondary | ICD-10-CM | POA: Diagnosis not present

## 2017-02-23 LAB — POCT INFLUENZA A/B
Influenza A, POC: NEGATIVE
Influenza B, POC: NEGATIVE

## 2017-02-23 LAB — POCT RAPID STREP A (OFFICE): Rapid Strep A Screen: NEGATIVE

## 2017-02-23 MED ORDER — BENZONATATE 100 MG PO CAPS: 200.0000 mg | ORAL_CAPSULE | Freq: Two times a day (BID) | ORAL | 0 refills | Status: AC | PRN

## 2017-02-23 NOTE — Progress Notes (Signed)
ACUTE VISIT  HPI:  Chief Complaint  Patient presents with  . Sore Throat  . Cough    [name] is a 26 y.o.female here today complaining of 2 days of respiratory symptoms.  "Bad sore throat", exacerbated by cough. Denies dysphonia or dysphagia.   Sore Throat   This is a new problem. The problem has been unchanged. The pain is moderate. Associated symptoms include congestion and coughing. Pertinent negatives include no abdominal pain, diarrhea, ear pain, headaches, hoarse voice, plugged ear sensation, neck pain, shortness of breath, stridor, swollen glands or trouble swallowing. She has had no exposure to strep or mono. The treatment provided mild relief.   She had nausea and and vomiting day one, resolved.  Cough, non productive. + Nasal congestion, rhinorrhea, and post nasal drainage. She felt warm and cold today but did not check temp.   No Hx of recent travel. + Sick contact, her 2 yo son was sick recently with URI. Her 46 yo had a classmate Dx with Flu. No known insect bite.  Hx of allergies: Denies  OTC medications for this problem: Dayquil    Review of Systems  Constitutional: Positive for appetite change, chills and fatigue. Negative for activity change and unexpected weight change.  HENT: Positive for congestion, postnasal drip, rhinorrhea and sore throat. Negative for ear pain, facial swelling, hoarse voice, mouth sores, nosebleeds, sinus pressure, trouble swallowing and voice change.   Eyes: Negative for discharge, redness and itching.  Respiratory: Positive for cough. Negative for chest tightness, shortness of breath, wheezing and stridor.   Cardiovascular: Negative for leg swelling.  Gastrointestinal: Negative for abdominal pain and diarrhea.  Musculoskeletal: Negative for gait problem, myalgias and neck pain.  Skin: Negative for pallor and rash.  Allergic/Immunologic: Negative for environmental allergies.  Neurological: Negative for  syncope, weakness and headaches.  Hematological: Negative for adenopathy. Does not bruise/bleed easily.  Psychiatric/Behavioral: Negative for confusion. The patient is not nervous/anxious.      Current Outpatient Prescriptions on File Prior to Visit  Medication Sig Dispense Refill  . levonorgestrel (MIRENA) 20 MCG/24HR IUD 1 each by Intrauterine route once.    . valACYclovir (VALTREX) 1000 MG tablet Take 1 tablet (1,000 mg total) by mouth 3 (three) times daily. 21 tablet 0   No current facility-administered medications on file prior to visit.      Past Medical History:  Diagnosis Date  . Anemia    microcytic hypochromic anemia  . GERD (gastroesophageal reflux disease)   . Hx of varicella   . Hydronephrosis   . Migraine    No Known Allergies  Social History   Social History  . Marital status: Single    Spouse name: N/A  . Number of children: 2  . Years of education: N/A   Occupational History  . [place] History Main Topics  . Smoking status: Never Smoker  . Smokeless tobacco: Never Used  . Alcohol use 0.0 oz/week     Comment: Occasional glass of wine  . Drug use: No  . Sexual activity: Yes    Birth control/ protection: None   Other Topics Concern  . 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   Physical Exam  Nursing note and vitals reviewed. Constitutional: She is oriented to person, place, and time. She appears well-developed. No distress.  HENT:  Head: Normocephalic and atraumatic.  Right Ear: Tympanic membrane, external ear and ear canal normal.  Left Ear: Tympanic membrane, external ear and ear canal normal.  Nose: Rhinorrhea present. Right sinus exhibits no  maxillary sinus tenderness and no frontal sinus tenderness. Left sinus exhibits no maxillary sinus tenderness and no frontal sinus tenderness.  Mouth/Throat: Uvula is midline and mucous membranes are normal. Posterior oropharyngeal erythema present. No oropharyngeal exudate or posterior oropharyngeal edema.  Hyperemic nasal mucosa and hypertrophic turbinates. Postnasal drainage.  Eyes: Conjunctivae are normal.  Neck: No edema and no erythema present.  Cardiovascular: Normal rate and regular rhythm.   No murmur heard. Respiratory: Effort normal and breath sounds normal. No respiratory distress.  Lymphadenopathy:       Head (right side): No submandibular adenopathy present.       Head (left side): No submandibular adenopathy present.    She has cervical adenopathy.       Right cervical: Posterior cervical adenopathy present.       Left cervical: Posterior cervical adenopathy present.  Neurological: She is alert and oriented to person, place, and time. She has normal strength. Coordination and gait normal.  Skin: Skin is warm. No rash noted. No erythema.  Psychiatric: Her mood appears anxious.  Well groomed, good eye contact.    ASSESSMENT AND PLAN:   Ms. Cindy Dodson was seen today for sore throat and cough.  Diagnoses and all orders for this visit:  Acute pharyngitis, unspecified etiology  Clinical findings do not suggest strep throat. Rapid strep negative, she agrees with not sending it for Cx.  -     POC Rapid Strep A  URI, acute  Symptoms suggests a viral etiology, symptomatic treatment recommended, so I do not think abx is needed at this time. Instructed to monitor for signs of complications, monitor temp, and clearly instructed about warning signs. I also explained that cough and nasal congestion can last a few days and sometimes weeks. F/U as needed.  -     POC Influenza A/B -     benzonatate (TESSALON) 100 MG capsule; Take 2 capsules (200 mg total) by mouth 2 (two)  times daily as needed.      -Ms. Cindy Dodson was advised to seek attention immediately if symptoms worsen or to follow if they persist or new concerns arise.       Cindy Lezotte G. [place], MD  [facility]. [place] office.

## 2017-02-23 NOTE — Patient Instructions (Addendum)
  [name] I have seen you today for an acute visit.  A few things to remember from today's visit:   Acute pharyngitis, unspecified etiology - Plan: POC Rapid Strep A  URI, acute - Plan: POC Influenza A/B  viral infections are self-limited and we treat each symptom depending of severity.  Over the counter medications as decongestants and cold medications usually help, they need to be taken with caution if there is a history of high blood pressure or palpitations. Tylenol and/or Ibuprofen also helps with most symptoms (headache, muscle aching, fever,etc) Plenty of fluids. Honey helps with cough. Steam inhalations helps with runny nose, nasal congestion, and may prevent sinus infections. Cough and nasal congestion could last a few days and sometimes weeks. Please follow in not any better in 1-2 weeks or if symptoms get worse.  Symptomatic treatment: Over the counter Acetaminophen 500 mg and/or Ibuprofen (400-600 mg) if there is not contraindications; you can alternate in between both every 4-6 hours. Gargles with saline water and throat lozenges might also help. Cold fluids.    Seek prompt medical evaluation if you are having difficulty breathing, mouth swelling, throat closing up, not able to swallow liquids (drooling), skin rash/bruising, or worsening symptoms.  Please follow up in 2 weeks if not any better.    Medications prescribed today are intended for short period of time and will not be refill upon request, a follow up appointment might be necessary to discuss continuation of of treatment if appropriate.     In general please monitor for signs of worsening symptoms and seek immediate medical attention if any concerning.  If symptoms are not resolved in 1-2 weeks you should schedule a follow up appointment with your doctor, before if needed.  I hope you get better soon!

## 2017-06-18 NOTE — Progress Notes (Signed)
Chief Complaint  Patient presents with  . Annual Exam    requests iron levels. Chest tightness/pain x 1.5 months, not constant.     HPI: Patient  Cindy Dodson  27 y.o. comes in today for Preventive Health Care visit and couple of concerns    BP  Not checked  but family hx of ht.  Father side  Has  Gyne check next week   Walks 3 x per day  To see obgyne Friday . Borderline high in HS .   Ears feel clogged  No pain   Intermittent chest tightness assoc with raspid heart rate   See below  Not assoc with exercise    No period  On iud  Breast tender   Health Maintenance  Topic Date Due  . TETANUS/TDAP  05/31/2007  . INFLUENZA VACCINE  12/10/2016  . PAP SMEAR  06/26/2017 (Originally 09/10/2015)  . HIV Screening  Completed   Health Maintenance Review LIFESTYLE:  Exercise:   Yes   Tobacco/ETS: no Alcohol:  Wine 2-3 per week.  Sugar beverages:  No fast food   .  Sleep: around 7-8  Drug use: no HH of  6   Work:  m- th full days     [employer] firm   Off Friday     ROS: ears clogged  Feeling cold  And ocass  Sharp pain and heart racing  Apple watch   138  Baseline 70.   Out of the blu tachy cardia .   Once  Woke up  5 minutes  GEN/ HEENT: No fever, significant weight changes sweats headaches vision problems hearing changes, CV/ PULM; No chest pain shortness of breath cough, syncope,edema  change in exercise tolerance. GI /GU: No adominal pain, vomiting, change in bowel habits. No blood in the stool. No significant GU symptoms. SKIN/HEME: ,no acute skin rashes suspicious lesions or bleeding. No lymphadenopathy, nodules, masses.  NEURO/ PSYCH:  No neurologic signs such as weakness numbness. No depression anxiety. IMM/ Allergy: No unusual infections.  Allergy .   REST of 12 system review negative except as per HPI   Past Medical History:  Diagnosis Date  . Anemia    microcytic hypochromic anemia  . GERD (gastroesophageal reflux disease)   . Hx of varicella   . Hydronephrosis    . Migraine     Past Surgical History:  Procedure Laterality Date  . NO PAST SURGERIES      Family History  Problem Relation Age of Onset  . Lung cancer Father        non smoker raised with ets   . Diabetes Maternal Aunt   . Anemia Maternal Aunt   . Diabetes Maternal Uncle   . Diabetes Maternal Grandmother   . Diabetes Maternal Grandfather   . Stroke Maternal Grandfather   . Heart disease Maternal Grandfather   . Diabetes Paternal Grandmother   . Lung cancer Paternal Grandfather        lung  . Cancer - Lung Cousin   . Anemia Sister   . Thyroid disease Sister        ra1 ablation?    Social History   Socioeconomic History  . Marital status: Single    Spouse name: None  . Number of children: 2  . Years of education: None  . Highest education level: None  Social Needs  . Financial resource strain: None  . Food insecurity - worry: None  . Food insecurity - inability: None  .  Transportation needs - medical: None  . Transportation needs - non-medical: None  Occupational History  . Occupation: Bank Teller  Tobacco Use  . Smoking status: Never Smoker  . Smokeless tobacco: Never Used  Substance and Sexual Activity  . Alcohol use: Yes    Alcohol/week: 0.0 oz    Comment: Occasional glass of wine  . Drug use: No  . Sexual activity: Yes    Birth control/protection: None  Other Topics Concern  . None  Social History Narrative   Averages 9 hours of sleep per night   6 people living in the home 2 young children    2 indoor Multimedia programmer    g2 p2 single     Outpatient Medications Prior to Visit  Medication Sig Dispense Refill  . levonorgestrel (MIRENA) 20 MCG/24HR IUD 1 each by Intrauterine route once.    . valACYclovir (VALTREX) 1000 MG tablet Take 1 tablet (1,000 mg total) by mouth 3 (three) times daily. (Patient not taking: Reported on 06/19/2017) 21 tablet 0   No facility-administered medications prior to visit.      EXAM:  BP (!) 138/96  (BP Location: Right Arm, Patient Position: Sitting, Cuff Size: Normal)   Pulse 63   Temp 98.3 F (36.8 C) (Oral)   Ht 5' 4.75" (1.645 m)   Wt 193 lb (87.5 kg)   BMI 32.37 kg/m   Body mass index is 32.37 kg/m. Wt Readings from Last 3 Encounters:  06/19/17 193 lb (87.5 kg)  02/23/17 185 lb (83.9 kg)  10/07/16 180 lb 8 oz (81.9 kg)    Physical Exam: Vital signs reviewed JKD:TOIZ is a well-developed well-nourished alert cooperative    who appearsr stated age in no acute distress.  HEENT: normocephalic atraumatic , Eyes: PERRL EOM's full, conjunctiva clear, Nares: paten,t no deformity discharge or tenderness., Ears: no deformity EAC's clear TMs with normal landmarks. Mouth: clear OP, no lesions, edema.  Moist mucous membranes. Dentition in adequate repair. NECK: supple without masses, thyromegaly or bruits. CHEST/PULM:  Clear to auscultation and percussion breath sounds equal no wheeze , rales or rhonchi. No chest wall deformities or tenderness. Breast: normal by inspection . No dimpling, discharge, masses, tenderness or discharge . CV: PMI is nondisplaced, S1 S2 no gallops, murmurs, rubs. Peripheral pulses are full without delay.No JVD .  ABDOMEN: Bowel sounds normal nontender  No guard or rebound, no hepato splenomegal no CVA tenderness.  No hernia. Extremtities:  No clubbing cyanosis or edema, no acute joint swelling or redness no focal atrophy NEURO:  Oriented x3, cranial nerves 3-12 appear to be intact, no obvious focal weakness,gait within normal limits no abnormal reflexes or asymmetrical SKIN: No acute rashes normal turgor, color, no bruising or petechiae. PSYCH: Oriented, good eye contact, no obvious depression anxiety, cognition and judgment appear normal. LN: no cervical axillary inguinal adenopathy  Lab Results  Component Value Date   WBC 6.0 06/12/2014   HGB 13.6 06/12/2014   HCT 39.5 06/12/2014   PLT 177 06/12/2014   GLUCOSE 105 (H) 06/12/2014   CHOL 147 01/17/2014    TRIG 83.0 01/17/2014   HDL 41.30 01/17/2014   LDLCALC 89 01/17/2014   ALT 11 06/12/2014   AST 18 06/12/2014   NA 137 06/12/2014   K 3.2 (L) 06/12/2014   CL 106 06/12/2014   CREATININE 0.78 06/12/2014   BUN 9 06/12/2014   CO2 23 06/12/2014   TSH 0.88 01/17/2014  EKG nsr  Nl  intervals   Hearing Screening   '125Hz'$  '250Hz'$  '500Hz'$  '1000Hz'$  '2000Hz'$  '3000Hz'$  '4000Hz'$  '6000Hz'$  '8000Hz'$   Right ear:   '10 5 5 5 5    '$ Left ear:   '10 5 5 5 5       '$ BP Readings from Last 3 Encounters:  06/19/17 (!) 138/96  02/23/17 114/70  10/07/16 (!) 140/100   Wt Readings from Last 3 Encounters:  06/19/17 193 lb (87.5 kg)  02/23/17 185 lb (83.9 kg)  10/07/16 180 lb 8 oz (81.9 kg)     ASSESSMENT AND PLAN:  Discussed the following assessment and plan:  Visit for preventive health examination - Plan: Basic metabolic panel, Hepatic function panel, Lipid panel, TSH, T4, free, Iron, TIBC and Ferritin Panel, EKG 12-Lead, CBC with Differential/Platelet, CANCELED: CBC with Differential/Platelet  Racing heart beat - spells ? could be svt based on hx  disc eval   Dr Einar Gip sees family consider echo etc  excam reassuring but bp needs to be followed - Plan: Basic metabolic panel, Hepatic function panel, Lipid panel, TSH, T4, free, Iron, TIBC and Ferritin Panel, EKG 12-Lead, Ambulatory referral to Cardiology, CBC with Differential/Platelet, CANCELED: CBC with Differential/Platelet  Clogged ear, bilateral - Plan: Basic metabolic panel, Hepatic function panel, Lipid panel, TSH, T4, free, Iron, TIBC and Ferritin Panel, CBC with Differential/Platelet, CANCELED: CBC with Differential/Platelet  Cold intolerance - r/o anemia  low iron and thryoid dysfunction - Plan: Basic metabolic panel, Hepatic function panel, Lipid panel, TSH, T4, free, Iron, TIBC and Ferritin Panel, CBC with Differential/Platelet, CANCELED: CBC with Differential/Platelet  Feeling of chest tightness - Plan: Ambulatory referral to Cardiology  Elevated blood pressure  reading - Plan: Ambulatory referral to Cardiology  Patient Care Team: Burnis Medin, MD as PCP - General (Internal Medicine) Servando Salina, MD as Consulting Physician (Obstetrics and Gynecology) Patient Instructions  Will notify you  of labs when available. Depending on labs  Will most likely do cardiology referral for  Rapid heart rate episodes  . Maybe an electrical problem   .  Healthy lifestyle includes : At least 150 minutes of exercise weeks  , weight at healthy levels, which is usually   BMI 19-25. Avoid trans fats and processed foods;  Increase fresh fruits and veges to 5 servings per day. And avoid sweet beverages including tea and juice. Mediterranean diet with olive oil and nuts have been noted to be heart and brain healthy . Avoid tobacco products . Limit  alcohol to  7 per week for women and 14 servings for men.  Get adequate sleep . Wear seat belts . Don't text and drive .   hearing is good    Health Maintenance, Female Adopting a healthy lifestyle and getting preventive care can go a long way to promote health and wellness. Talk with your health care provider about what schedule of regular examinations is right for you. This is a good chance for you to check in with your provider about disease prevention and staying healthy. In between checkups, there are plenty of things you can do on your own. Experts have done a lot of research about which lifestyle changes and preventive measures are most likely to keep you healthy. Ask your health care provider for more information. Weight and diet Eat a healthy diet  Be sure to include plenty of vegetables, fruits, low-fat dairy products, and lean protein.  Do not eat a lot of foods high in solid fats, added sugars, or salt.  Get regular exercise. This is  one of the most important things you can do for your health. ? Most adults should exercise for at least 150 minutes each week. The exercise should increase your heart  rate and make you sweat (moderate-intensity exercise). ? Most adults should also do strengthening exercises at least twice a week. This is in addition to the moderate-intensity exercise.  Maintain a healthy weight  Body mass index (BMI) is a measurement that can be used to identify possible weight problems. It estimates body fat based on height and weight. Your health care provider can help determine your BMI and help you achieve or maintain a healthy weight.  For females 58 years of age and older: ? A BMI below 18.5 is considered underweight. ? A BMI of 18.5 to 24.9 is normal. ? A BMI of 25 to 29.9 is considered overweight. ? A BMI of 30 and above is considered obese.  Watch levels of cholesterol and blood lipids  You should start having your blood tested for lipids and cholesterol at 29 years of age, then have this test every 5 years.  You may need to have your cholesterol levels checked more often if: ? Your lipid or cholesterol levels are high. ? You are older than 29 years of age. ? You are at high risk for heart disease.  Cancer screening Lung Cancer  Lung cancer screening is recommended for adults 54-41 years old who are at high risk for lung cancer because of a history of smoking.  A yearly low-dose CT scan of the lungs is recommended for people who: ? Currently smoke. ? Have quit within the past 15 years. ? Have at least a 30-pack-year history of smoking. A pack year is smoking an average of one pack of cigarettes a day for 1 year.  Yearly screening should continue until it has been 15 years since you quit.  Yearly screening should stop if you develop a health problem that would prevent you from having lung cancer treatment.  Breast Cancer  Practice breast self-awareness. This means understanding how your breasts normally appear and feel.  It also means doing regular breast self-exams. Let your health care provider know about any changes, no matter how small.  If  you are in your 20s or 30s, you should have a clinical breast exam (CBE) by a health care provider every 1-3 years as part of a regular health exam.  If you are 85 or older, have a CBE every year. Also consider having a breast X-ray (mammogram) every year.  If you have a family history of breast cancer, talk to your health care provider about genetic screening.  If you are at high risk for breast cancer, talk to your health care provider about having an MRI and a mammogram every year.  Breast cancer gene (BRCA) assessment is recommended for women who have family members with BRCA-related cancers. BRCA-related cancers include: ? Breast. ? Ovarian. ? Tubal. ? Peritoneal cancers.  Results of the assessment will determine the need for genetic counseling and BRCA1 and BRCA2 testing.  Cervical Cancer Your health care provider may recommend that you be screened regularly for cancer of the pelvic organs (ovaries, uterus, and vagina). This screening involves a pelvic examination, including checking for microscopic changes to the surface of your cervix (Pap test). You may be encouraged to have this screening done every 3 years, beginning at age 29.  For women ages 2-65, health care providers may recommend pelvic exams and Pap testing every 3 years, or  they may recommend the Pap and pelvic exam, combined with testing for human papilloma virus (HPV), every 5 years. Some types of HPV increase your risk of cervical cancer. Testing for HPV may also be done on women of any age with unclear Pap test results.  Other health care providers may not recommend any screening for nonpregnant women who are considered low risk for pelvic cancer and who do not have symptoms. Ask your health care provider if a screening pelvic exam is right for you.  If you have had past treatment for cervical cancer or a condition that could lead to cancer, you need Pap tests and screening for cancer for at least 20 years after your  treatment. If Pap tests have been discontinued, your risk factors (such as having a new sexual partner) need to be reassessed to determine if screening should resume. Some women have medical problems that increase the chance of getting cervical cancer. In these cases, your health care provider may recommend more frequent screening and Pap tests.  Colorectal Cancer  This type of cancer can be detected and often prevented.  Routine colorectal cancer screening usually begins at 29 years of age and continues through 29 years of age.  Your health care provider may recommend screening at an earlier age if you have risk factors for colon cancer.  Your health care provider may also recommend using home test kits to check for hidden blood in the stool.  A small camera at the end of a tube can be used to examine your colon directly (sigmoidoscopy or colonoscopy). This is done to check for the earliest forms of colorectal cancer.  Routine screening usually begins at age 44.  Direct examination of the colon should be repeated every 5-10 years through 29 years of age. However, you may need to be screened more often if early forms of precancerous polyps or small growths are found.  Skin Cancer  Check your skin from head to toe regularly.  Tell your health care provider about any new moles or changes in moles, especially if there is a change in a mole's shape or color.  Also tell your health care provider if you have a mole that is larger than the size of a pencil eraser.  Always use sunscreen. Apply sunscreen liberally and repeatedly throughout the day.  Protect yourself by wearing long sleeves, pants, a wide-brimmed hat, and sunglasses whenever you are outside.  Heart disease, diabetes, and high blood pressure  High blood pressure causes heart disease and increases the risk of stroke. High blood pressure is more likely to develop in: ? People who have blood pressure in the high end of the normal  range (130-139/85-89 mm Hg). ? People who are overweight or obese. ? People who are African American.  If you are 10-47 years of age, have your blood pressure checked every 3-5 years. If you are 24 years of age or older, have your blood pressure checked every year. You should have your blood pressure measured twice-once when you are at a hospital or clinic, and once when you are not at a hospital or clinic. Record the average of the two measurements. To check your blood pressure when you are not at a hospital or clinic, you can use: ? An automated blood pressure machine at a pharmacy. ? A home blood pressure monitor.  If you are between 56 years and 34 years old, ask your health care provider if you should take aspirin to prevent strokes.  Have regular diabetes screenings. This involves taking a blood sample to check your fasting blood sugar level. ? If you are at a normal weight and have a low risk for diabetes, have this test once every three years after 29 years of age. ? If you are overweight and have a high risk for diabetes, consider being tested at a younger age or more often. Preventing infection Hepatitis B  If you have a higher risk for hepatitis B, you should be screened for this virus. You are considered at high risk for hepatitis B if: ? You were born in a country where hepatitis B is common. Ask your health care provider which countries are considered high risk. ? Your parents were born in a high-risk country, and you have not been immunized against hepatitis B (hepatitis B vaccine). ? You have HIV or AIDS. ? You use needles to inject street drugs. ? You live with someone who has hepatitis B. ? You have had sex with someone who has hepatitis B. ? You get hemodialysis treatment. ? You take certain medicines for conditions, including cancer, organ transplantation, and autoimmune conditions.  Hepatitis C  Blood testing is recommended for: ? Everyone born from [birth year] through  1967. ? Anyone with known risk factors for hepatitis C.  Sexually transmitted infections (STIs)  You should be screened for sexually transmitted infections (STIs) including gonorrhea and chlamydia if: ? You are sexually active and are younger than 29 years of age. ? You are older than 30 years of age and your health care provider tells you that you are at risk for this type of infection. ? Your sexual activity has changed since you were last screened and you are at an increased risk for chlamydia or gonorrhea. Ask your health care provider if you are at risk.  If you do not have HIV, but are at risk, it may be recommended that you take a prescription medicine daily to prevent HIV infection. This is called pre-exposure prophylaxis (PrEP). You are considered at risk if: ? You are sexually active and do not regularly use condoms or know the HIV status of your partner(s). ? You take drugs by injection. ? You are sexually active with a partner who has HIV.  Talk with your health care provider about whether you are at high risk of being infected with HIV. If you choose to begin PrEP, you should first be tested for HIV. You should then be tested every 3 months for as long as you are taking PrEP. Pregnancy  If you are premenopausal and you may become pregnant, ask your health care provider about preconception counseling.  If you may become pregnant, take 400 to 800 micrograms (mcg) of folic acid every day.  If you want to prevent pregnancy, talk to your health care provider about birth control (contraception). Osteoporosis and menopause  Osteoporosis is a disease in which the bones lose minerals and strength with aging. This can result in serious bone fractures. Your risk for osteoporosis can be identified using a bone density scan.  If you are 20 years of age or older, or if you are at risk for osteoporosis and fractures, ask your health care provider if you should be screened.  Ask your health  care provider whether you should take a calcium or vitamin D supplement to lower your risk for osteoporosis.  Menopause may have certain physical symptoms and risks.  Hormone replacement therapy may reduce some of these symptoms and risks. Talk to your  health care provider about whether hormone replacement therapy is right for you. Follow these instructions at home:  Schedule regular health, dental, and eye exams.  Stay current with your immunizations.  Do not use any tobacco products including cigarettes, chewing tobacco, or electronic cigarettes.  If you are pregnant, do not drink alcohol.  If you are breastfeeding, limit how much and how often you drink alcohol.  Limit alcohol intake to no more than 1 drink per day for nonpregnant women. One drink equals 12 ounces of beer, 5 ounces of wine, or 1 ounces of hard liquor.  Do not use street drugs.  Do not share needles.  Ask your health care provider for help if you need support or information about quitting drugs.  Tell your health care provider if you often feel depressed.  Tell your health care provider if you have ever been abused or do not feel safe at home. This information is not intended to replace advice given to you by your health care provider. Make sure you discuss any questions you have with your health care provider. Document Released: 11/11/2010 Document Revised: 10/04/2015 Document Reviewed: 01/30/2015 Elsevier Interactive Patient Education  2018 Villalba. Panosh M.D.

## 2017-06-19 ENCOUNTER — Ambulatory Visit (INDEPENDENT_AMBULATORY_CARE_PROVIDER_SITE_OTHER): Payer: BLUE CROSS/BLUE SHIELD | Admitting: Internal Medicine

## 2017-06-19 ENCOUNTER — Encounter: Payer: Self-pay | Admitting: Internal Medicine

## 2017-06-19 VITALS — BP 138/96 | HR 63 | Temp 98.3°F | Ht 64.75 in | Wt 193.0 lb

## 2017-06-19 DIAGNOSIS — H938X3 Other specified disorders of ear, bilateral: Secondary | ICD-10-CM

## 2017-06-19 DIAGNOSIS — Z0000 Encounter for general adult medical examination without abnormal findings: Secondary | ICD-10-CM | POA: Diagnosis not present

## 2017-06-19 DIAGNOSIS — R0789 Other chest pain: Secondary | ICD-10-CM

## 2017-06-19 DIAGNOSIS — R000 Tachycardia, unspecified: Secondary | ICD-10-CM | POA: Diagnosis not present

## 2017-06-19 DIAGNOSIS — R030 Elevated blood-pressure reading, without diagnosis of hypertension: Secondary | ICD-10-CM | POA: Diagnosis not present

## 2017-06-19 DIAGNOSIS — R6889 Other general symptoms and signs: Secondary | ICD-10-CM | POA: Diagnosis not present

## 2017-06-19 LAB — HEPATIC FUNCTION PANEL
ALT: 10 U/L (ref 0–35)
AST: 15 U/L (ref 0–37)
Albumin: 4.3 g/dL (ref 3.5–5.2)
Alkaline Phosphatase: 26 U/L — ABNORMAL LOW (ref 39–117)
Bilirubin, Direct: 0.1 mg/dL (ref 0.0–0.3)
Total Bilirubin: 0.8 mg/dL (ref 0.2–1.2)
Total Protein: 6.7 g/dL (ref 6.0–8.3)

## 2017-06-19 LAB — CBC WITH DIFFERENTIAL/PLATELET
Basophils Absolute: 41 cells/uL (ref 0–200)
Basophils Relative: 0.9 %
Eosinophils Absolute: 41 cells/uL (ref 15–500)
Eosinophils Relative: 0.9 %
HCT: 41.3 % (ref 35.0–45.0)
Hemoglobin: 13.9 g/dL (ref 11.7–15.5)
Lymphs Abs: 1803 cells/uL (ref 850–3900)
MCH: 26.6 pg — ABNORMAL LOW (ref 27.0–33.0)
MCHC: 33.7 g/dL (ref 32.0–36.0)
MCV: 79 fL — ABNORMAL LOW (ref 80.0–100.0)
MPV: 9.8 fL (ref 7.5–12.5)
Monocytes Relative: 9.2 %
Neutro Abs: 2291 cells/uL (ref 1500–7800)
Neutrophils Relative %: 49.8 %
Platelets: 255 10*3/uL (ref 140–400)
RBC: 5.23 10*6/uL — ABNORMAL HIGH (ref 3.80–5.10)
RDW: 12.6 % (ref 11.0–15.0)
Total Lymphocyte: 39.2 %
WBC mixed population: 423 cells/uL (ref 200–950)
WBC: 4.6 10*3/uL (ref 3.8–10.8)

## 2017-06-19 LAB — IRON,TIBC AND FERRITIN PANEL
%SAT: 52 % (calc) — ABNORMAL HIGH (ref 11–50)
Ferritin: 39 ng/mL (ref 10–154)
Iron: 139 ug/dL (ref 40–190)
TIBC: 266 mcg/dL (calc) (ref 250–450)

## 2017-06-19 LAB — BASIC METABOLIC PANEL
BUN: 12 mg/dL (ref 6–23)
CO2: 30 mEq/L (ref 19–32)
Calcium: 9.1 mg/dL (ref 8.4–10.5)
Chloride: 106 mEq/L (ref 96–112)
Creatinine, Ser: 0.74 mg/dL (ref 0.40–1.20)
GFR: 119.28 mL/min (ref >60.00)
Glucose, Bld: 83 mg/dL (ref 70–99)
Potassium: 4 mEq/L (ref 3.5–5.1)
Sodium: 138 mEq/L (ref 135–145)

## 2017-06-19 LAB — TSH: TSH: 0.79 u[IU]/mL (ref 0.35–4.50)

## 2017-06-19 LAB — LIPID PANEL
Cholesterol: 160 mg/dL (ref 0–200)
HDL: 49.8 mg/dL (ref >39.00)
LDL Cholesterol: 99 mg/dL (ref 0–99)
NonHDL: 110.53
Total CHOL/HDL Ratio: 3
Triglycerides: 56 mg/dL (ref 0.0–149.0)
VLDL: 11.2 mg/dL (ref 0.0–40.0)

## 2017-06-19 LAB — T4, FREE: Free T4: 0.86 ng/dL (ref 0.60–1.60)

## 2017-06-19 NOTE — Patient Instructions (Signed)
Will notify you  of labs when available. Depending on labs  Will most likely do cardiology referral for  Rapid heart rate episodes  . Maybe an electrical problem   .  Healthy lifestyle includes : At least 150 minutes of exercise weeks  , weight at healthy levels, which is usually   BMI 19-25. Avoid trans fats and processed foods;  Increase fresh fruits and veges to 5 servings per day. And avoid sweet beverages including tea and juice. Mediterranean diet with olive oil and nuts have been noted to be heart and brain healthy . Avoid tobacco products . Limit  alcohol to  7 per week for women and 14 servings for men.  Get adequate sleep . Wear seat belts . Don't text and drive .   hearing is good    Health Maintenance, Female Adopting a healthy lifestyle and getting preventive care can go a long way to promote health and wellness. Talk with your health care provider about what schedule of regular examinations is right for you. This is a good chance for you to check in with your provider about disease prevention and staying healthy. In between checkups, there are plenty of things you can do on your own. Experts have done a lot of research about which lifestyle changes and preventive measures are most likely to keep you healthy. Ask your health care provider for more information. Weight and diet Eat a healthy diet  Be sure to include plenty of vegetables, fruits, low-fat dairy products, and lean protein.  Do not eat a lot of foods high in solid fats, added sugars, or salt.  Get regular exercise. This is one of the most important things you can do for your health. ? Most adults should exercise for at least 150 minutes each week. The exercise should increase your heart rate and make you sweat (moderate-intensity exercise). ? Most adults should also do strengthening exercises at least twice a week. This is in addition to the moderate-intensity exercise.  Maintain a healthy weight  Body mass  index (BMI) is a measurement that can be used to identify possible weight problems. It estimates body fat based on height and weight. Your health care provider can help determine your BMI and help you achieve or maintain a healthy weight.  For females 13 years of age and older: ? A BMI below 18.5 is considered underweight. ? A BMI of 18.5 to 24.9 is normal. ? A BMI of 25 to 29.9 is considered overweight. ? A BMI of 30 and above is considered obese.  Watch levels of cholesterol and blood lipids  You should start having your blood tested for lipids and cholesterol at 29 years of age, then have this test every 5 years.  You may need to have your cholesterol levels checked more often if: ? Your lipid or cholesterol levels are high. ? You are older than 29 years of age. ? You are at high risk for heart disease.  Cancer screening Lung Cancer  Lung cancer screening is recommended for adults 20-50 years old who are at high risk for lung cancer because of a history of smoking.  A yearly low-dose CT scan of the lungs is recommended for people who: ? Currently smoke. ? Have quit within the past 15 years. ? Have at least a 30-pack-year history of smoking. A pack year is smoking an average of one pack of cigarettes a day for 1 year.  Yearly screening should continue until it has been 15  years since you quit.  Yearly screening should stop if you develop a health problem that would prevent you from having lung cancer treatment.  Breast Cancer  Practice breast self-awareness. This means understanding how your breasts normally appear and feel.  It also means doing regular breast self-exams. Let your health care provider know about any changes, no matter how small.  If you are in your 20s or 30s, you should have a clinical breast exam (CBE) by a health care provider every 1-3 years as part of a regular health exam.  If you are 69 or older, have a CBE every year. Also consider having a breast  X-ray (mammogram) every year.  If you have a family history of breast cancer, talk to your health care provider about genetic screening.  If you are at high risk for breast cancer, talk to your health care provider about having an MRI and a mammogram every year.  Breast cancer gene (BRCA) assessment is recommended for women who have family members with BRCA-related cancers. BRCA-related cancers include: ? Breast. ? Ovarian. ? Tubal. ? Peritoneal cancers.  Results of the assessment will determine the need for genetic counseling and BRCA1 and BRCA2 testing.  Cervical Cancer Your health care provider may recommend that you be screened regularly for cancer of the pelvic organs (ovaries, uterus, and vagina). This screening involves a pelvic examination, including checking for microscopic changes to the surface of your cervix (Pap test). You may be encouraged to have this screening done every 3 years, beginning at age 80.  For women ages 68-65, health care providers may recommend pelvic exams and Pap testing every 3 years, or they may recommend the Pap and pelvic exam, combined with testing for human papilloma virus (HPV), every 5 years. Some types of HPV increase your risk of cervical cancer. Testing for HPV may also be done on women of any age with unclear Pap test results.  Other health care providers may not recommend any screening for nonpregnant women who are considered low risk for pelvic cancer and who do not have symptoms. Ask your health care provider if a screening pelvic exam is right for you.  If you have had past treatment for cervical cancer or a condition that could lead to cancer, you need Pap tests and screening for cancer for at least 20 years after your treatment. If Pap tests have been discontinued, your risk factors (such as having a new sexual partner) need to be reassessed to determine if screening should resume. Some women have medical problems that increase the chance of  getting cervical cancer. In these cases, your health care provider may recommend more frequent screening and Pap tests.  Colorectal Cancer  This type of cancer can be detected and often prevented.  Routine colorectal cancer screening usually begins at 29 years of age and continues through 29 years of age.  Your health care provider may recommend screening at an earlier age if you have risk factors for colon cancer.  Your health care provider may also recommend using home test kits to check for hidden blood in the stool.  A small camera at the end of a tube can be used to examine your colon directly (sigmoidoscopy or colonoscopy). This is done to check for the earliest forms of colorectal cancer.  Routine screening usually begins at age 78.  Direct examination of the colon should be repeated every 5-10 years through 29 years of age. However, you may need to be screened more often  if early forms of precancerous polyps or small growths are found.  Skin Cancer  Check your skin from head to toe regularly.  Tell your health care provider about any new moles or changes in moles, especially if there is a change in a mole's shape or color.  Also tell your health care provider if you have a mole that is larger than the size of a pencil eraser.  Always use sunscreen. Apply sunscreen liberally and repeatedly throughout the day.  Protect yourself by wearing long sleeves, pants, a wide-brimmed hat, and sunglasses whenever you are outside.  Heart disease, diabetes, and high blood pressure  High blood pressure causes heart disease and increases the risk of stroke. High blood pressure is more likely to develop in: ? People who have blood pressure in the high end of the normal range (130-139/85-89 mm Hg). ? People who are overweight or obese. ? People who are African American.  If you are 31-73 years of age, have your blood pressure checked every 3-5 years. If you are 66 years of age or older,  have your blood pressure checked every year. You should have your blood pressure measured twice-once when you are at a hospital or clinic, and once when you are not at a hospital or clinic. Record the average of the two measurements. To check your blood pressure when you are not at a hospital or clinic, you can use: ? An automated blood pressure machine at a pharmacy. ? A home blood pressure monitor.  If you are between 36 years and 21 years old, ask your health care provider if you should take aspirin to prevent strokes.  Have regular diabetes screenings. This involves taking a blood sample to check your fasting blood sugar level. ? If you are at a normal weight and have a low risk for diabetes, have this test once every three years after 29 years of age. ? If you are overweight and have a high risk for diabetes, consider being tested at a younger age or more often. Preventing infection Hepatitis B  If you have a higher risk for hepatitis B, you should be screened for this virus. You are considered at high risk for hepatitis B if: ? You were born in a country where hepatitis B is common. Ask your health care provider which countries are considered high risk. ? Your parents were born in a high-risk country, and you have not been immunized against hepatitis B (hepatitis B vaccine). ? You have HIV or AIDS. ? You use needles to inject street drugs. ? You live with someone who has hepatitis B. ? You have had sex with someone who has hepatitis B. ? You get hemodialysis treatment. ? You take certain medicines for conditions, including cancer, organ transplantation, and autoimmune conditions.  Hepatitis C  Blood testing is recommended for: ? Everyone born from [birth year] through 1967. ? Anyone with known risk factors for hepatitis C.  Sexually transmitted infections (STIs)  You should be screened for sexually transmitted infections (STIs) including gonorrhea and chlamydia if: ? You are sexually  active and are younger than 29 years of age. ? You are older than 29 years of age and your health care provider tells you that you are at risk for this type of infection. ? Your sexual activity has changed since you were last screened and you are at an increased risk for chlamydia or gonorrhea. Ask your health care provider if you are at risk.  If you do not  have HIV, but are at risk, it may be recommended that you take a prescription medicine daily to prevent HIV infection. This is called pre-exposure prophylaxis (PrEP). You are considered at risk if: ? You are sexually active and do not regularly use condoms or know the HIV status of your partner(s). ? You take drugs by injection. ? You are sexually active with a partner who has HIV.  Talk with your health care provider about whether you are at high risk of being infected with HIV. If you choose to begin PrEP, you should first be tested for HIV. You should then be tested every 3 months for as long as you are taking PrEP. Pregnancy  If you are premenopausal and you may become pregnant, ask your health care provider about preconception counseling.  If you may become pregnant, take 400 to 800 micrograms (mcg) of folic acid every day.  If you want to prevent pregnancy, talk to your health care provider about birth control (contraception). Osteoporosis and menopause  Osteoporosis is a disease in which the bones lose minerals and strength with aging. This can result in serious bone fractures. Your risk for osteoporosis can be identified using a bone density scan.  If you are 41 years of age or older, or if you are at risk for osteoporosis and fractures, ask your health care provider if you should be screened.  Ask your health care provider whether you should take a calcium or vitamin D supplement to lower your risk for osteoporosis.  Menopause may have certain physical symptoms and risks.  Hormone replacement therapy may reduce some of these  symptoms and risks. Talk to your health care provider about whether hormone replacement therapy is right for you. Follow these instructions at home:  Schedule regular health, dental, and eye exams.  Stay current with your immunizations.  Do not use any tobacco products including cigarettes, chewing tobacco, or electronic cigarettes.  If you are pregnant, do not drink alcohol.  If you are breastfeeding, limit how much and how often you drink alcohol.  Limit alcohol intake to no more than 1 drink per day for nonpregnant women. One drink equals 12 ounces of beer, 5 ounces of wine, or 1 ounces of hard liquor.  Do not use street drugs.  Do not share needles.  Ask your health care provider for help if you need support or information about quitting drugs.  Tell your health care provider if you often feel depressed.  Tell your health care provider if you have ever been abused or do not feel safe at home. This information is not intended to replace advice given to you by your health care provider. Make sure you discuss any questions you have with your health care provider. Document Released: 11/11/2010 Document Revised: 10/04/2015 Document Reviewed: 01/30/2015 Elsevier Interactive Patient Education  [employer].

## 2017-06-25 ENCOUNTER — Telehealth: Payer: Self-pay | Admitting: Internal Medicine

## 2017-06-25 NOTE — Telephone Encounter (Signed)
Copied from [facility] [id number]. Topic: Inquiry >> Jun 25, 2017 11:27 AM Cecelia Byars, NT wrote: Reason for CRM: [facility]  called and needs last office notes labs ,and ekg faxed to [phone] or call [place]  [phone] her appointment is on 07/01/17

## 2017-06-25 NOTE — Telephone Encounter (Signed)
This was a referral, can you please resend request for notes.

## 2017-10-23 NOTE — Telephone Encounter (Signed)
Preadmission screen  

## 2018-06-09 ENCOUNTER — Encounter: Payer: Self-pay | Admitting: Family Medicine

## 2018-06-09 ENCOUNTER — Ambulatory Visit (INDEPENDENT_AMBULATORY_CARE_PROVIDER_SITE_OTHER): Payer: Self-pay | Admitting: Family Medicine

## 2018-06-09 VITALS — BP 128/90 | HR 100 | Temp 99.0°F | Wt 204.1 lb

## 2018-06-09 DIAGNOSIS — B349 Viral infection, unspecified: Secondary | ICD-10-CM

## 2018-06-09 MED ORDER — PROMETHAZINE HCL 25 MG PO TABS
25.0000 mg | ORAL_TABLET | ORAL | 0 refills | Status: DC | PRN
Start: 2018-06-09 — End: 2018-08-18

## 2018-06-09 NOTE — Progress Notes (Signed)
   Subjective:    Patient ID: Cindy Dodson, female    DOB: 04-Aug-1990, 28 y.o.   MRN: [id number]  HPI Here for the onset this morning of headache, nausea without vomiting, and mild abdominal cramps. No fever. No ST or cough. Her daughter has had similar symptoms for the past 2 days.  Review of Systems  Constitutional: Negative.   HENT: Negative.   Eyes: Negative.   Respiratory: Negative.   Cardiovascular: Negative.   Gastrointestinal: Positive for abdominal pain and nausea. Negative for abdominal distention, anal bleeding, blood in stool, constipation, diarrhea and vomiting.  Neurological: Positive for headaches.       Objective:   Physical Exam Constitutional:      Appearance: She is well-developed. She is not ill-appearing.  Cardiovascular:     Rate and Rhythm: Normal rate and regular rhythm.     Pulses: Normal pulses.     Heart sounds: Normal heart sounds.  Pulmonary:     Effort: Pulmonary effort is normal.     Breath sounds: Normal breath sounds.  Abdominal:     General: Abdomen is flat. Bowel sounds are normal. There is no distension.     Palpations: Abdomen is soft. There is no mass.     Tenderness: There is no abdominal tenderness. There is no guarding or rebound.     Hernia: No hernia is present.  Neurological:     Mental Status: She is alert.           Assessment & Plan:  Viral illness. Drink fluids, use Tylenol for headache, use Phenergan for nausea. Written out of work today and tomorrow.  Alysia Penna, MD

## 2018-08-18 ENCOUNTER — Encounter (HOSPITAL_COMMUNITY): Payer: Self-pay | Admitting: Emergency Medicine

## 2018-08-18 ENCOUNTER — Emergency Department (HOSPITAL_COMMUNITY): Payer: BLUE CROSS/BLUE SHIELD

## 2018-08-18 ENCOUNTER — Emergency Department (HOSPITAL_COMMUNITY)
Admission: EM | Admit: 2018-08-18 | Discharge: 2018-08-19 | Disposition: A | Discharge status: Home / Self Care | Payer: BLUE CROSS/BLUE SHIELD | Attending: Emergency Medicine | Admitting: Emergency Medicine

## 2018-08-18 ENCOUNTER — Other Ambulatory Visit: Payer: Self-pay

## 2018-08-18 DIAGNOSIS — N83299 Other ovarian cyst, unspecified side: Secondary | ICD-10-CM

## 2018-08-18 DIAGNOSIS — Z79899 Other long term (current) drug therapy: Secondary | ICD-10-CM | POA: Diagnosis not present

## 2018-08-18 DIAGNOSIS — N83292 Other ovarian cyst, left side: Secondary | ICD-10-CM | POA: Insufficient documentation

## 2018-08-18 DIAGNOSIS — R1032 Left lower quadrant pain: Secondary | ICD-10-CM

## 2018-08-18 DIAGNOSIS — R109 Unspecified abdominal pain: Secondary | ICD-10-CM | POA: Diagnosis present

## 2018-08-18 LAB — URINALYSIS, ROUTINE W REFLEX MICROSCOPIC
Bacteria, UA: NONE SEEN
Bilirubin Urine: NEGATIVE
Glucose, UA: NEGATIVE mg/dL
Hgb urine dipstick: NEGATIVE
Ketones, ur: NEGATIVE mg/dL
Leukocytes,Ua: NEGATIVE
Nitrite: NEGATIVE
Protein, ur: 100 mg/dL — AB
Specific Gravity, Urine: 1.026 (ref 1.005–1.030)
pH: 9 — ABNORMAL HIGH (ref 5.0–8.0)

## 2018-08-18 LAB — COMPREHENSIVE METABOLIC PANEL
ALT: 16 U/L (ref 0–44)
AST: 17 U/L (ref 15–41)
Albumin: 4 g/dL (ref 3.5–5.0)
Alkaline Phosphatase: 33 U/L — ABNORMAL LOW (ref 38–126)
Anion gap: 10 (ref 5–15)
BUN: 9 mg/dL (ref 6–20)
CO2: 24 mmol/L (ref 22–32)
Calcium: 9 mg/dL (ref 8.9–10.3)
Chloride: 104 mmol/L (ref 98–111)
Creatinine, Ser: 0.86 mg/dL (ref 0.44–1.00)
GFR calc Af Amer: >60 mL/min (ref >60)
GFR calc non Af Amer: >60 mL/min (ref >60)
Glucose, Bld: 90 mg/dL (ref 70–99)
Potassium: 3.7 mmol/L (ref 3.5–5.1)
Sodium: 138 mmol/L (ref 135–145)
Total Bilirubin: 1.1 mg/dL (ref 0.3–1.2)
Total Protein: 6.8 g/dL (ref 6.5–8.1)

## 2018-08-18 LAB — LIPASE, BLOOD: Lipase: 38 U/L (ref 11–51)

## 2018-08-18 LAB — CBC
HCT: 42.7 % (ref 36.0–46.0)
Hemoglobin: 13.7 g/dL (ref 12.0–15.0)
MCH: 26.3 pg (ref 26.0–34.0)
MCHC: 32.1 g/dL (ref 30.0–36.0)
MCV: 82 fL (ref 80.0–100.0)
Platelets: 241 10*3/uL (ref 150–400)
RBC: 5.21 MIL/uL — ABNORMAL HIGH (ref 3.87–5.11)
RDW: 11.9 % (ref 11.5–15.5)
WBC: 10.5 10*3/uL (ref 4.0–10.5)
nRBC: 0 % (ref 0.0–0.2)

## 2018-08-18 LAB — I-STAT BETA HCG BLOOD, ED (MC, WL, AP ONLY): I-stat hCG, quantitative: <5 m[IU]/mL (ref <5)

## 2018-08-18 MED ORDER — IOHEXOL 350 MG/ML SOLN
100.0000 mL | Freq: Once | INTRAVENOUS | Status: AC | PRN
  Administered 2018-08-18: 100 mL via INTRAVENOUS

## 2018-08-18 MED ORDER — ONDANSETRON HCL 4 MG/2ML IJ SOLN
4.0000 mg | Freq: Once | INTRAMUSCULAR | Status: AC
  Administered 2018-08-18: 4 mg via INTRAVENOUS
  Filled 2018-08-18: qty 2

## 2018-08-18 MED ORDER — MORPHINE SULFATE (PF) 4 MG/ML IV SOLN
4.0000 mg | Freq: Once | INTRAVENOUS | Status: AC
  Administered 2018-08-18: 4 mg via INTRAVENOUS
  Filled 2018-08-18: qty 1

## 2018-08-18 MED ORDER — PROMETHAZINE HCL 25 MG PO TABS: 25.0000 mg | ORAL_TABLET | Freq: Four times a day (QID) | ORAL | 0 refills | Status: AC | PRN

## 2018-08-18 MED ORDER — SODIUM CHLORIDE 0.9% FLUSH
3.0000 mL | Freq: Once | INTRAVENOUS | Status: AC
  Administered 2018-08-18: 3 mL via INTRAVENOUS

## 2018-08-18 MED ORDER — HYDROCODONE-ACETAMINOPHEN 5-325 MG PO TABS: 1.0000 | ORAL_TABLET | Freq: Four times a day (QID) | ORAL | 0 refills | Status: DC | PRN

## 2018-08-18 MED ORDER — AMOXICILLIN-POT CLAVULANATE 875-125 MG PO TABS: 1.0000 | ORAL_TABLET | Freq: Two times a day (BID) | ORAL | 0 refills | Status: DC

## 2018-08-18 NOTE — ED Triage Notes (Signed)
C/o LLQ pain since 9am and unable to urinate.  Denies nausea, vomiting, and diarrhea.  Last BM yesterday- WNL.  Also reports chills.

## 2018-08-18 NOTE — ED Notes (Signed)
Patient transported to CT 

## 2018-08-18 NOTE — ED Provider Notes (Signed)
[facility] EMERGENCY DEPARTMENT Provider Note   CSN: [id number] Arrival date & time: 08/18/18  1933    History   Chief Complaint Chief Complaint  Patient presents with   Abdominal Pain    HPI Cindy Dodson is a 28 y.o. female.     HPI Patient presents to the emergency department with left-sided abdominal pain that started earlier today.  The patient states that nothing seems make the condition better or worse.  She states that the pain is escalated throughout the day.  Patient states she has not had any diarrhea or vomiting.  Patient states she not had any fevers.  The patient denies chest pain, shortness of breath, headache,blurred vision, neck pain, fever, cough, weakness, numbness, dizziness, anorexia, edema,  vomiting, diarrhea, rash, back pain, dysuria, hematemesis, bloody stool, near syncope, or syncope. Past Medical History:  Diagnosis Date   Anemia    microcytic hypochromic anemia   GERD (gastroesophageal reflux disease)    Hx of varicella    Hydronephrosis    Migraine     Patient Active Problem List   Diagnosis Date Noted   Family history of high cholesterol 01/17/2014   Numbness and tingling of right arm 11/03/2013   Arm pain, right 11/03/2013   Headache 06/29/2013   Skin lesion 06/29/2013   Family history of diabetes mellitus 06/29/2013   Family history of lung cancer 06/29/2013   Postpartum care following vaginal delivery  (8/27) 01/05/2013    Past Surgical History:  Procedure Laterality Date   NO PAST SURGERIES       OB History    Gravida  2   Para  2   Term  2   Preterm  0   AB  0   Living  2     SAB  0   TAB  0   Ectopic  0   Multiple  0   Live Births  2            Home Medications    Prior to Admission medications   Medication Sig Start Date End Date Taking? Authorizing Provider  acetaminophen (TYLENOL) 325 MG tablet Take 650 mg by mouth every 6 (six) hours as needed.    [provider]  levonorgestrel (MIRENA) 20 MCG/24HR IUD 1 each by Intrauterine route once.    Servando Salina, MD  promethazine (PHENERGAN) 25 MG tablet Take 1 tablet (25 mg total) by mouth every 4 (four) hours as needed for nausea or vomiting. 06/09/18   Laurey Morale, MD    Family History Family History  Problem Relation Age of Onset   Lung cancer Father        non smoker raised with ets    Diabetes Maternal Aunt    Anemia Maternal Aunt    Diabetes Maternal Uncle    Diabetes Maternal Grandmother    Diabetes Maternal Grandfather    Stroke Maternal Grandfather    Heart disease Maternal Grandfather    Diabetes Paternal Grandmother    Lung cancer Paternal Grandfather        lung   Cancer - Lung Cousin    Anemia Sister    Thyroid disease Sister        ra1 ablation?    Social History Social History   Tobacco Use   Smoking status: Never Smoker   Smokeless tobacco: Never Used  Substance Use Topics   Alcohol use: Yes    Alcohol/week: 0.0 standard drinks    Comment:  Occasional glass of wine   Drug use: No     Allergies   Patient has no known allergies.   Review of Systems Review of Systems All other systems negative except as documented in the HPI. All pertinent positives and negatives as reviewed in the HPI.   Physical Exam Updated Vital Signs BP (!) 126/93    Pulse 100    Temp 99.8 F (37.7 C) (Oral)    Resp 16    SpO2 100%   Physical Exam Vitals signs and nursing note reviewed.  Constitutional:      General: She is not in acute distress.    Appearance: She is well-developed.  HENT:     Head: Normocephalic and atraumatic.  Eyes:     Pupils: Pupils are equal, round, and reactive to light.  Neck:     Musculoskeletal: Normal range of motion and neck supple.  Cardiovascular:     Rate and Rhythm: Normal rate and regular rhythm.     Heart sounds: Normal heart sounds. No murmur. No friction rub. No gallop.   Pulmonary:     Effort: Pulmonary  effort is normal. No respiratory distress.     Breath sounds: Normal breath sounds. No wheezing.  Abdominal:     General: Bowel sounds are normal. There is no distension.     Palpations: Abdomen is soft.     Tenderness: There is abdominal tenderness in the left lower quadrant. There is no guarding or rebound.     Hernia: No hernia is present.  Skin:    General: Skin is warm and dry.     Capillary Refill: Capillary refill takes less than 2 seconds.     Findings: No erythema or rash.  Neurological:     Mental Status: She is alert and oriented to person, place, and time.     Motor: No abnormal muscle tone.     Coordination: Coordination normal.  Psychiatric:        Behavior: Behavior normal.      ED Treatments / Results  Labs (all labs ordered are listed, but only abnormal results are displayed) Labs Reviewed  COMPREHENSIVE METABOLIC PANEL - Abnormal; Notable for the following components:      Result Value   Alkaline Phosphatase 33 (*)    All other components within normal limits  CBC - Abnormal; Notable for the following components:   RBC 5.21 (*)    All other components within normal limits  URINALYSIS, ROUTINE W REFLEX MICROSCOPIC - Abnormal; Notable for the following components:   pH 9.0 (*)    Protein, ur 100 (*)    All other components within normal limits  LIPASE, BLOOD  I-STAT BETA HCG BLOOD, ED ([facility], [facility], [facility] ONLY)    EKG None  Radiology US Transvaginal Non-ob  Result Date: 08/18/2018 CLINICAL DATA:  Initial evaluation for acute onset left lower quadrant pain. EXAM: TRANSABDOMINAL AND TRANSVAGINAL ULTRASOUND OF PELVIS DOPPLER ULTRASOUND OF OVARIES TECHNIQUE: Both transabdominal and transvaginal ultrasound examinations of the pelvis were performed. Transabdominal technique was performed for global imaging of the pelvis including uterus, ovaries, adnexal regions, and pelvic cul-de-sac. It was necessary to proceed with endovaginal exam following the transabdominal exam to  visualize the uterus, endometrium, and ovaries. Color and duplex Doppler ultrasound was utilized to evaluate blood flow to the ovaries. COMPARISON:  None available. FINDINGS: Uterus Measurements: 7.3 x 3.9 x 5.1 cm = volume: 70.6 mL. No fibroids or other mass visualized. Endometrium Endometrial thickness not well assessed due to  adjacent IUD. IUD appears to be in appropriate position within the endometrial cavity. No focal abnormality. Right ovary Measurements: 2.7 x 2.9 x 1.9 cm = volume: 8.4 mL. Normal appearance/no adnexal mass. Left ovary Measurements: 4.6 x 9.4 x 2.7 cm = volume: 32.8 mL. 2.9 cm complex cystic lesion, indeterminate, but suspected to reflect either a degenerating corpus luteal cyst or possibly hemorrhagic cyst. Pulsed Doppler evaluation of both ovaries demonstrates normal low-resistance arterial and venous waveforms. Other findings Small volume free fluid within the pelvis. IMPRESSION: 1. 2.9 cm complex left ovarian cystic lesion, indeterminate, but favored to reflect either a hemorrhagic cyst or degenerating corpus luteal cyst. Follow-up ultrasound in 6-12 weeks to ensure resolution is suggested as clinically warranted. 2. Associated small volume free physiologic fluid within the pelvis. 3. No other acute abnormality.  No evidence for ovarian torsion. 4. IUD in appropriate position within the endometrial cavity. Electronically Signed   By: Jeannine Boga M.D.   On: 08/18/2018 22:01   US Pelvis Complete  Result Date: 08/18/2018 CLINICAL DATA:  Initial evaluation for acute onset left lower quadrant pain. EXAM: TRANSABDOMINAL AND TRANSVAGINAL ULTRASOUND OF PELVIS DOPPLER ULTRASOUND OF OVARIES TECHNIQUE: Both transabdominal and transvaginal ultrasound examinations of the pelvis were performed. Transabdominal technique was performed for global imaging of the pelvis including uterus, ovaries, adnexal regions, and pelvic cul-de-sac. It was necessary to proceed with endovaginal exam following  the transabdominal exam to visualize the uterus, endometrium, and ovaries. Color and duplex Doppler ultrasound was utilized to evaluate blood flow to the ovaries. COMPARISON:  None available. FINDINGS: Uterus Measurements: 7.3 x 3.9 x 5.1 cm = volume: 70.6 mL. No fibroids or other mass visualized. Endometrium Endometrial thickness not well assessed due to adjacent IUD. IUD appears to be in appropriate position within the endometrial cavity. No focal abnormality. Right ovary Measurements: 2.7 x 2.9 x 1.9 cm = volume: 8.4 mL. Normal appearance/no adnexal mass. Left ovary Measurements: 4.6 x 9.4 x 2.7 cm = volume: 32.8 mL. 2.9 cm complex cystic lesion, indeterminate, but suspected to reflect either a degenerating corpus luteal cyst or possibly hemorrhagic cyst. Pulsed Doppler evaluation of both ovaries demonstrates normal low-resistance arterial and venous waveforms. Other findings Small volume free fluid within the pelvis. IMPRESSION: 1. 2.9 cm complex left ovarian cystic lesion, indeterminate, but favored to reflect either a hemorrhagic cyst or degenerating corpus luteal cyst. Follow-up ultrasound in 6-12 weeks to ensure resolution is suggested as clinically warranted. 2. Associated small volume free physiologic fluid within the pelvis. 3. No other acute abnormality.  No evidence for ovarian torsion. 4. IUD in appropriate position within the endometrial cavity. Electronically Signed   By: Jeannine Boga M.D.   On: 08/18/2018 22:01   Ct Abdomen Pelvis W Contrast  Result Date: 08/18/2018 CLINICAL DATA:  Left lower quadrant pain. EXAM: CT ABDOMEN AND PELVIS WITH CONTRAST TECHNIQUE: Multidetector CT imaging of the abdomen and pelvis was performed using the standard protocol following bolus administration of intravenous contrast. CONTRAST:  13mL OMNIPAQUE IOHEXOL 350 MG/ML SOLN COMPARISON:  Pelvic ultrasound earlier this day. Abdominopelvic CT 07/18/2013 FINDINGS: Lower chest: Lung bases are clear.  Hepatobiliary: 7 mm cyst in the right lobe of the liver. No suspicious hepatic lesion. Gallbladder physiologically distended, no calcified stone. No biliary dilatation. Pancreas: No ductal dilatation or inflammation. Spleen: Normal in size without focal abnormality. Adrenals/Urinary Tract: Normal adrenal glands. 4 mm nonobstructing stone in the lower right kidney. No hydronephrosis or perinephric edema. Urinary bladder is partially distended. Stomach/Bowel: Moderate length segment  of colonic wall thickening extending from the distal descending to the proximal sigmoid colon. Associated pericolonic edema and fat stranding with small amount of free fluid. There a few scattered diverticula in this region but no focal inflamed diverticulitis. No perforation or abscess. Small volume of stool in the remainder the colon. The appendix courses into the central pelvis and is normal. No small bowel dilatation, obstruction, or inflammation. Bowel loops in the left lower quadrant in the region of colonic inflammation are fluid-filled and slightly prominent, likely reactive. Vascular/Lymphatic: Normal caliber abdominal aorta. Portal vein and mesenteric vessels are patent. No abdominopelvic adenopathy. Reproductive: Retroverted uterus with IUD in place. Complex cyst in the left ovary as characterized on ultrasound. Other: Small amount of free fluid in the left pericolic gutter and in the pelvis. No free air or intra-abdominal abscess. Musculoskeletal: There are no acute or suspicious osseous abnormalities. IMPRESSION: 1. Colitis involving the distal descending and proximal sigmoid colon, may be infectious or inflammatory. This is slightly more distal than the colonic inflammation on 2015 CT. There are few scattered diverticula in this region, however length of colonic involvement favors colitis over diverticulitis. 2. Nonobstructing right renal stone. Electronically Signed   By: Keith Rake M.D.   On: 08/18/2018 23:14   [place]  Art/ven Flow Abd Pelv Doppler  Result Date: 08/18/2018 CLINICAL DATA:  Initial evaluation for acute onset left lower quadrant pain. EXAM: TRANSABDOMINAL AND TRANSVAGINAL ULTRASOUND OF PELVIS DOPPLER ULTRASOUND OF OVARIES TECHNIQUE: Both transabdominal and transvaginal ultrasound examinations of the pelvis were performed. Transabdominal technique was performed for global imaging of the pelvis including uterus, ovaries, adnexal regions, and pelvic cul-de-sac. It was necessary to proceed with endovaginal exam following the transabdominal exam to visualize the uterus, endometrium, and ovaries. Color and duplex Doppler ultrasound was utilized to evaluate blood flow to the ovaries. COMPARISON:  None available. FINDINGS: Uterus Measurements: 7.3 x 3.9 x 5.1 cm = volume: 70.6 mL. No fibroids or other mass visualized. Endometrium Endometrial thickness not well assessed due to adjacent IUD. IUD appears to be in appropriate position within the endometrial cavity. No focal abnormality. Right ovary Measurements: 2.7 x 2.9 x 1.9 cm = volume: 8.4 mL. Normal appearance/no adnexal mass. Left ovary Measurements: 4.6 x 9.4 x 2.7 cm = volume: 32.8 mL. 2.9 cm complex cystic lesion, indeterminate, but suspected to reflect either a degenerating corpus luteal cyst or possibly hemorrhagic cyst. Pulsed Doppler evaluation of both ovaries demonstrates normal low-resistance arterial and venous waveforms. Other findings Small volume free fluid within the pelvis. IMPRESSION: 1. 2.9 cm complex left ovarian cystic lesion, indeterminate, but favored to reflect either a hemorrhagic cyst or degenerating corpus luteal cyst. Follow-up ultrasound in 6-12 weeks to ensure resolution is suggested as clinically warranted. 2. Associated small volume free physiologic fluid within the pelvis. 3. No other acute abnormality.  No evidence for ovarian torsion. 4. IUD in appropriate position within the endometrial cavity. Electronically Signed   By: Jeannine Boga M.D.   On: 08/18/2018 22:01    Procedures Procedures (including critical care time)  Medications Ordered in ED Medications  sodium chloride flush (NS) 0.9 % injection 3 mL (3 mLs Intravenous Given 08/18/18 2228)  morphine 4 MG/ML injection 4 mg (4 mg Intravenous Given 08/18/18 2233)  ondansetron (ZOFRAN) injection 4 mg (4 mg Intravenous Given 08/18/18 2230)  iohexol (OMNIPAQUE) 350 MG/ML injection 100 mL (100 mLs Intravenous Contrast Given 08/18/18 2245)     Initial Impression / Assessment and Plan / ED Course  I  have reviewed the triage vital signs and the nursing notes.  Pertinent labs & imaging results that were available during my care of the patient were reviewed by me and considered in my medical decision making (see chart for details).        I initially started with an ultrasound due to the fact that I was concerned she may have ovarian torsion.  I then had a CT scan due to the fact that I was concerned that this complex cyst could represent an abscess that is formed in that region.  There is no abscess noted on CT scan.  There is some colitis mentioned as well as the ovarian cyst.  The patient follow-up with her primary doctor along with GYN.  I advised the patient to return here for any worsening her condition.  Final Clinical Impressions(s) / ED Diagnoses   Final diagnoses:  None    ED Discharge Orders    None       Dalia Heading, PA-C 08/18/18 2338    Lennice Sites, DO 08/18/18 2358

## 2018-08-18 NOTE — Discharge Instructions (Signed)
I would recommend following up with your primary doctor along with your GYN doctor.  Return here as needed.  Slowly increase your fluid intake.

## 2018-08-18 NOTE — ED Notes (Signed)
Patient transported to Ultrasound 

## 2019-01-27 ENCOUNTER — Other Ambulatory Visit: Payer: Self-pay

## 2019-01-27 DIAGNOSIS — Z20822 Contact with and (suspected) exposure to covid-19: Secondary | ICD-10-CM

## 2019-01-29 LAB — NOVEL CORONAVIRUS, NAA: SARS-CoV-2, NAA: NOT DETECTED

## 2019-03-16 ENCOUNTER — Other Ambulatory Visit: Payer: Self-pay

## 2019-03-16 DIAGNOSIS — Z20822 Contact with and (suspected) exposure to covid-19: Secondary | ICD-10-CM

## 2019-03-17 LAB — NOVEL CORONAVIRUS, NAA: SARS-CoV-2, NAA: NOT DETECTED

## 2019-04-18 ENCOUNTER — Other Ambulatory Visit: Payer: Self-pay

## 2019-04-18 DIAGNOSIS — Z20822 Contact with and (suspected) exposure to covid-19: Secondary | ICD-10-CM

## 2019-04-20 LAB — NOVEL CORONAVIRUS, NAA: SARS-CoV-2, NAA: NOT DETECTED

## 2019-11-16 ENCOUNTER — Ambulatory Visit: Payer: BLUE CROSS/BLUE SHIELD | Admitting: Cardiology

## 2019-11-16 ENCOUNTER — Other Ambulatory Visit: Payer: Self-pay

## 2019-11-16 ENCOUNTER — Encounter: Payer: Self-pay | Admitting: Cardiology

## 2019-11-16 VITALS — BP 135/87 | HR 79 | Ht 64.75 in | Wt 206.0 lb

## 2019-11-16 DIAGNOSIS — R002 Palpitations: Secondary | ICD-10-CM

## 2019-11-16 DIAGNOSIS — R030 Elevated blood-pressure reading, without diagnosis of hypertension: Secondary | ICD-10-CM

## 2019-11-16 MED ORDER — PROPRANOLOL HCL 20 MG PO TABS: 20.0000 mg | ORAL_TABLET | Freq: Three times a day (TID) | ORAL | 1 refills | Status: DC | PRN

## 2019-11-16 NOTE — Progress Notes (Signed)
Primary Physician/Referring:  Heywood Bene, PA-C  Patient ID: Cindy Dodson, female    DOB: Apr 07, 1991, 29 y.o.   MRN: [id number]  Chief Complaint  Patient presents with  . Palpitations  . Follow-up   HPI:    Cindy Dodson  is a 29 y.o. female patient who had seen in 2019 for palpitations, felt her symptoms are clearly related to PACs and PVCs, labile hypertension, anxiety made an appointment to see me as she has been having more frequent episodes of palpitations that started about 2 weeks ago.  Over the past 3 weeks, patient has been experiencing severe sinus infection, presently on antibiotics.  Otherwise no chest pain, no shortness of breath.  Most of the episodes of palpitations are occurring when she lays down to rest but not with exertional activity.  She has been trying to exercise on a regular basis without any limitations.  Past Medical History:  Diagnosis Date  . Anemia    microcytic hypochromic anemia  . GERD (gastroesophageal reflux disease)   . Hx of varicella   . Hydronephrosis   . Migraine    Past Surgical History:  Procedure Laterality Date  . NO PAST SURGERIES     Family History  Problem Relation Age of Onset  . Lung cancer Father        non smoker raised with ets   . Hypertension Father   . Hyperlipidemia Father   . Diabetes Maternal Aunt   . Anemia Maternal Aunt   . Diabetes Maternal Uncle   . Diabetes Maternal Grandmother   . Diabetes Maternal Grandfather   . Stroke Maternal Grandfather   . Heart disease Maternal Grandfather   . Diabetes Paternal Grandmother   . Lung cancer Paternal Grandfather        lung  . Cancer - Lung Cousin   . Thyroid disease Sister        ra1 ablation?  Marland Kitchen Anemia Sister   . Sleep apnea Mother     Social History   Tobacco Use  . Smoking status: Never Smoker  . Smokeless tobacco: Never Used  Substance Use Topics  . Alcohol use: Not Currently    Alcohol/week: 0.0 standard drinks   Marital Status:  Married  ROS  Review of Systems  Cardiovascular: Positive for irregular heartbeat. Negative for chest pain, dyspnea on exertion and leg swelling.  Respiratory: Positive for cough.   Gastrointestinal: Negative for melena.   Objective  Blood pressure 135/87, pulse 79, height 5' 4.75" (1.645 m), weight 206 lb (93.4 kg), SpO2 97 %.  Vitals with BMI 11/16/2019 08/18/2018 08/18/2018  Height 5' 4.75" - -  Weight 206 lbs - -  BMI 85.63 - -  Systolic 149 702 637  Diastolic 87 88 93  Pulse 79 103 100     Physical Exam Cardiovascular:     Rate and Rhythm: Normal rate and regular rhythm.     Pulses: Intact distal pulses.     Heart sounds: Normal heart sounds. No murmur heard.  No gallop.      Comments: No leg edema, no JVD. Pulmonary:     Effort: Pulmonary effort is normal.     Breath sounds: Normal breath sounds.  Abdominal:     General: Bowel sounds are normal.     Palpations: Abdomen is soft.    Laboratory examination:   No results for input(s): NA, K, CL, CO2, GLUCOSE, BUN, CREATININE, CALCIUM, GFRNONAA, GFRAA in the last 8760 hours. CrCl cannot be  calculated (Patient's most recent lab result is older than the maximum 21 days allowed.).  CMP Latest Ref Rng & Units 08/18/2018 06/19/2017 06/12/2014  Glucose 70 - 99 mg/dL 90 83 105(H)  BUN 6 - 20 mg/dL 9 12 9   Creatinine 0.44 - 1.00 mg/dL 0.86 0.74 0.78  Sodium 135 - 145 mmol/L 138 138 137  Potassium 3.5 - 5.1 mmol/L 3.7 4.0 3.2(L)  Chloride 98 - 111 mmol/L 104 106 106  CO2 22 - 32 mmol/L 24 30 23   Calcium 8.9 - 10.3 mg/dL 9.0 9.1 8.7  Total Protein 6.5 - 8.1 g/dL 6.8 6.7 6.4  Total Bilirubin 0.3 - 1.2 mg/dL 1.1 0.8 0.5  Alkaline Phos 38 - 126 U/L 33(L) 26(L) 36(L)  AST 15 - 41 U/L 17 15 18   ALT 0 - 44 U/L 16 10 11    CBC Latest Ref Rng & Units 08/18/2018 06/19/2017 06/12/2014  WBC 4.0 - 10.5 K/uL 10.5 4.6 6.0  Hemoglobin 12.0 - 15.0 g/dL 13.7 13.9 13.6  Hematocrit 36 - 46 % 42.7 41.3 39.5  Platelets 150 - 400 K/uL 241 255 177   Lipid  Panel No results for input(s): CHOL, TRIG, LDLCALC, VLDL, HDL, CHOLHDL, LDLDIRECT in the last 8760 hours.  HEMOGLOBIN A1C No results found for: HGBA1C, MPG TSH No results for input(s): TSH in the last 8760 hours.  External labs:   Medications and allergies  No Known Allergies   Current Outpatient Medications  Medication Instructions  . acetaminophen (TYLENOL) 650 mg, Every 6 hours PRN  . amoxicillin (AMOXIL) 875 mg, Oral, 2 times daily  . escitalopram (LEXAPRO) 10 mg, Oral, Daily  . levonorgestrel (MIRENA) 20 MCG/24HR IUD 1 each,  Once  . ondansetron (ZOFRAN-ODT) 4 mg, Every 8 hours PRN  . promethazine (PHENERGAN) 25 mg, Oral, Every 6 hours PRN  . propranolol (INDERAL) 20 mg, Oral, 3 times daily PRN    Radiology:   No results found.  Cardiac Studies:   None   EKG  EKG 11/16/2019: Normal sinus rhythm with rate of 72 bpm, normal axis.  No evidence of ischemia, nonspecific T abnormality.  Compared to 08/14/2017, nonspecific T abnormality is new.  Assessment     ICD-10-CM   1. Palpitations  R00.2 EKG 12-Lead    propranolol (INDERAL) 20 MG tablet     Medications Discontinued During This Encounter  Medication Reason  . amoxicillin-clavulanate (AUGMENTIN) 875-125 MG tablet Completed Course  . famotidine (PEPCID) 40 MG tablet Completed Course  . HYDROcodone-acetaminophen (NORCO/VICODIN) 5-325 MG tablet Completed Course  . hydrOXYzine (ATARAX/VISTARIL) 50 MG tablet Discontinued by provider     Recommendations:   [name]  is a  29 y.o.  female patient who had seen in 2019 for palpitations, felt her symptoms are clearly related to PACs and PVCs, labile hypertension, anxiety made an appointment to see me as she has been having more frequent episodes of palpitations that started about 2 weeks ago.  Patient symptoms are made worse by recent upper respiratory infection with sinusitis.  Her symptoms are mostly occurring at rest and suspect PACs and PVCs.  She  probably has hypertension, blood pressure was elevated even 2 years ago.  She is trying her best to make lifestyle changes which is appropriate however I have recommended that she start antihypertensive medications and she can wean herself off of it when she loses weight and continues to exercise.  For now due to palpitations, I have prescribed her propranolol 20 mg p.o. 3 times daily as  needed.  If we decide to proceed with long-term therapy for hypertension, would probably prefer either diltiazem CD or an ACE inhibitor as beta-blocker can cause issues with weight loss.  I will see her back on a as needed basis.  I reassured her.  Her EKG abnormalities in the form of nonspecific abnormality is probably related to underlying hypertension.  There is no family history of sudden cardiac death, otherwise she does not have any significant medical history to suspect malignant arrhythmias.   Adrian Prows, MD, [facility] 11/16/2019, 11:20 AM Office: [phone]

## 2020-06-27 NOTE — Progress Notes (Signed)
New Patient Note  RE: Cindy Dodson MRN: [id number] DOB: 06-Jun-1990 Date of Office Visit: 06/28/2020  Referring provider: Heywood Bene, * Primary care provider: Heywood Bene, PA-C  Chief Complaint: Cough (Vomits with cough)  History of Present Illness: I had the pleasure of seeing Cindy Dodson for initial evaluation at the [facility] [place] on 06/28/2020. She is a 30 y.o. female, who is self-referred here for the evaluation of coughing.  She reports symptoms of coughing with post tussive emesis mainly at night. Sometimes has shortness of breath and PND. This has been going on for about 1 year but worse the 4-5 months.   Current medications include none. She tried the following inhalers: none. Main triggers are dust, fans, air conditioning.   In the last month, frequency of symptoms: daily. Frequency of nocturnal symptoms: nightly. Frequency of SABA use: 0x/week. Interference with physical activity: no. Sleep is disturbed. In the last 12 months, emergency room visits/urgent care visits/doctor office visits or hospitalizations due to respiratory issues: 0. In the last 12 months, oral steroids courses: 0. Lifetime history of hospitalization for respiratory issues: no. Prior intubations: no. History of pneumonia: no. She was not evaluated by allergist/pulmonologist in the past. Smoking exposure: denies. Up to date with flu vaccine: no. Up to date with COVID-19 vaccine: yes. No prior COVID-19 diagnosis.  History of reflux: currently taking pantoprazole 40mg  BID with some benefit.  Patient was recently diagnosed with [facility].   Assessment and Plan: Cindy Dodson is a 29 y.o. female with: Coughing Coughing with posttussive emesis at night.  Occasionally has shortness of breath with postnasal drip for 1 year but worsening the last 4-5 months.  No prior inhaler use or asthma diagnosis.  Triggers include dust, fans and air conditioning.  No prior COVID-19 diagnosis.   Takes pantoprazole 40 mg twice a day.  Recently diagnosed with idiopathic intracranial hypertension.  Today's spirometry shows some restriction with no improvement in FEV1 post bronchodilator treatment.  Clinically feeling improved.  Today's skin testing showed: Positive to grass, trees, horse, cat, mold.  . The most common causes of chronic cough include the following: upper airway cough syndrome (UACS) which is caused by variety of rhinitis conditions; asthma; gastroesophageal reflux disease (GERD); chronic bronchitis from cigarette smoking or other inhaled environmental irritants; non-asthmatic eosinophilic bronchitis; and bronchiectasis.  . In prospective studies, these conditions have accounted for up to 94% of the causes of chronic cough in immunocompetent adults.   Given daily symptoms will treat with ICS/LABA inhaler and nasal spray first.  Daily controller medication(s): Start Breo 118mcg 1 puff daily and rinse mouth after each use.  Demonstrated proper use. May use albuterol rescue inhaler 2 puffs every 4 to 6 hours as needed for shortness of breath, chest tightness, coughing, and wheezing. May use albuterol rescue inhaler 2 puffs 5 to 15 minutes prior to strenuous physical activities. Monitor frequency of use.  Get spirometry at next visit.  Other allergic rhinitis Mild rhinitis symptoms and ENT evaluation shows slight deviated septum. Had periorbital swelling after horse exposure - daughter rides horses.   Today's skin testing showed: Positive to grass, trees, horse, cat, mold.   Start environmental control measures as below.  May use over the counter antihistamines such as Zyrtec (cetirizine), Claritin (loratadine), Allegra (fexofenadine), or Xyzal (levocetirizine) daily as needed. Start dymista (fluticasone + azelastine nasal spray combination) 1 spray per nostril twice a day. If it's not covered let us know.   Nasal saline spray (  i.e., Simply Saline) or nasal saline lavage  (i.e., NeilMed) is recommended as needed and prior to medicated nasal sprays.  Heartburn Currently on pantoprazole 40 mg twice a day and had recent EGD with palpitations.  Denies any prior diagnosis of Eoe.  Noted that toast, chips and cheeze it seem to cause esophageal discomfort and is concerned about possible food allergies.  Discussed with patient at length that skin prick testing and bloodwork (food IgE levels) check for IgE mediated reactions which her clinical presentation does not support. She would still like to pursue food allergy testing today to rule things out.  Today's skin testing was negative to select foods.   Recommend to follow up with GI if symptoms worsen.   Avoid foods that seem to bother you - toast, cheeze it.   Continue pantoprazole 40mg  twice a day.   Continue heartburn lifestyle and dietary restrictions.   Return in about 2 months (around 08/26/2020).  Meds ordered this encounter  Medications  . fluticasone furoate-vilanterol (BREO ELLIPTA) 100-25 MCG/INH AEPB    Sig: Inhale 1 puff into the lungs daily. Rinse mouth after each use    Dispense:  60 each    Refill:  3  . Azelastine-Fluticasone 137-50 MCG/ACT SUSP    Sig: Place 1 spray into the nose in the morning and at bedtime.    Dispense:  23 g    Refill:  5  . albuterol (VENTOLIN HFA) 108 (90 Base) MCG/ACT inhaler    Sig: Inhale 2 puffs into the lungs every 4 (four) hours as needed for wheezing or shortness of breath (coughing fits).    Dispense:  18 g    Refill:  2   Other allergy screening: Rhino conjunctivitis: no  Sometimes has rhinitis symptoms.  Saw ENT - slight deviated septum. Food allergy: no  Noticed that toast, chips and cheeze its seems to cause esophageal discomfort. She had dilatations of the esophagus in the past but no prior EoE.  Medication allergy: no Hymenoptera allergy: no Urticaria: no Eczema:no History of recurrent infections suggestive of immunodeficency:  no  Diagnostics: Spirometry:  Tracings reviewed. Her effort: Good reproducible efforts. FVC: 2.51L FEV1: 2.22L, 80% predicted FEV1/FVC ratio: 88% Interpretation: Spirometry consistent with possible restrictive disease with no improvement in FEV1 post bronchodilator treatment.  Clinically feeling slightly improved. Please see scanned spirometry results for details.  Skin Testing: Environmental allergy panel and select foods. Positive to grass, trees, horse, cat, mold.  Negative to select foods. Results discussed with patient/family.  Pediatric Percutaneous Testing - 06/28/20 1444    Time Antigen Placed 4010    Allergen Manufacturer Lavella Hammock    Location Back    Number of Test 30    Pediatric Panel Foods    1. Control-buffer 50% Glycerol Negative    2. Control-Histamine1mg /ml 2+    3. Peanut Negative    4. Soy bean food Negative    5. Wheat, whole Negative    6. Sesame Negative    7. Milk, cow Negative    8. Egg white, chicken Negative    9. Casein Negative    10. Cashew Negative    11. Pecan  Negative    12. Nowata Negative    13. Shellfish Negative    14. Shrimp Negative    15. Fish Mix Negative    16. Flounder Negative    17. Pork Negative    18. [place] Meat Negative    19. Beef Negative    20. Lamb Negative  21. Chicken Meat Negative    22. Rice Negative    23. White Potato Negative    24. Tomato Negative    25. Orange Negative    26. Banana Negative    27. Apple Negative    28. Peach Negative    29. Potato, Sweet Negative    30. Pea, Green/English Negative    31. Corn  Negative          Airborne Adult Perc - 06/28/20 1443    Time Antigen Placed 1443    Allergen Manufacturer Lavella Hammock    Location Back    Number of Test 59    Panel 1 Select    1. Control-Buffer 50% Glycerol Negative    2. Control-Histamine 1 mg/ml 2+    3. Albumin saline Negative    4. [place] Negative    5. [place] Negative    6. Johnson Negative    7. Jordan Blue Negative    8. Meadow  Fescue Negative    9. Perennial Rye Negative    10. Sweet Vernal Negative    11. Timothy Negative    12. Cocklebur Negative    13. Burweed Marshelder Negative    14. Ragweed, short Negative    15. Ragweed, Giant Negative    16. Plantain,  English Negative    17. Lamb's Quarters Negative    18. Sheep Sorrell Negative    19. Rough Pigweed Negative    20. Marsh Elder, Rough Negative    21. Mugwort, Common Negative    22. Ash mix --   +/-   23. Birch mix Negative    24. Beech American Negative    25. Box, Elder 2+    26. Cedar, red Negative    27. Cottonwood, [place] --   +/-   73. Elm mix Negative    29. Hickory Negative    30. Maple mix 2+    31. Oak, [place] mix Negative    32. Pecan Pollen Negative    33. Pine mix --   +/-   34. Sycamore Eastern Negative    35. Oden, Black Pollen Negative    36. Alternaria alternata Negative    37. Cladosporium Herbarum Negative    38. Aspergillus mix Negative    39. Penicillium mix Negative    40. Bipolaris sorokiniana (Helminthosporium) Negative    41. Drechslera spicifera (Curvularia) Negative    42. Mucor plumbeus Negative    43. Fusarium moniliforme Negative    44. Aureobasidium pullulans (pullulara) Negative    45. Rhizopus oryzae Negative    46. Botrytis cinera Negative    47. Epicoccum nigrum Negative    48. Phoma betae Negative    49. Candida Albicans Negative    50. Trichophyton mentagrophytes Negative    51. Mite, D Farinae  5,000 AU/ml Negative    52. Mite, D Pteronyssinus  5,000 AU/ml Negative    53. Cat Hair 10,000 BAU/ml 3+    54.  Dog Epithelia Negative    55. Mixed Feathers Negative    56. Horse Epithelia 4+    57. Cockroach, German Negative    58. Mouse Negative    59. Tobacco Leaf Negative          Intradermal - 06/28/20 1607    Time Antigen Placed 1520    Allergen Manufacturer Greer    Location Arm    Number of Test 12    Intradermal Select    Control Negative    Gu[place] Negative  Johnson Negative     7 Grass Negative    Ragweed mix Negative    Weed mix Negative    Mold 1 Negative    Mold 2 Negative    Mold 3 Negative    Mold 4 Negative    Dog Negative    Mite mix Negative           Past Medical History: Patient Active Problem List   Diagnosis Date Noted  . Coughing 06/28/2020  . Other allergic rhinitis 06/28/2020  . Heartburn 06/28/2020  . Adverse reaction to food, subsequent encounter 06/28/2020  . Family history of high cholesterol 01/17/2014  . Numbness and tingling of right arm 11/03/2013  . Arm pain, right 11/03/2013  . Headache 06/29/2013  . Skin lesion 06/29/2013  . Family history of diabetes mellitus 06/29/2013  . Family history of lung cancer 06/29/2013  . Postpartum care following vaginal delivery  (8/27) 01/05/2013   Past Medical History:  Diagnosis Date  . Anemia    microcytic hypochromic anemia  . GERD (gastroesophageal reflux disease)   . Hx of varicella   . Hydronephrosis   . Migraine    Past Surgical History: Past Surgical History:  Procedure Laterality Date  . NO PAST SURGERIES     Medication List:  Current Outpatient Medications  Medication Sig Dispense Refill  . albuterol (VENTOLIN HFA) 108 (90 Base) MCG/ACT inhaler Inhale 2 puffs into the lungs every 4 (four) hours as needed for wheezing or shortness of breath (coughing fits). 18 g 2  . Azelastine-Fluticasone 137-50 MCG/ACT SUSP Place 1 spray into the nose in the morning and at bedtime. 23 g 5  . benazepril (LOTENSIN) 10 MG tablet Take 10 mg by mouth daily.    Marland Kitchen escitalopram (LEXAPRO) 10 MG tablet Take 10 mg by mouth daily.    . fluticasone furoate-vilanterol (BREO ELLIPTA) 100-25 MCG/INH AEPB Inhale 1 puff into the lungs daily. Rinse mouth after each use 60 each 3  . levonorgestrel (MIRENA) 20 MCG/24HR IUD 1 each by Intrauterine route once.    . pantoprazole (PROTONIX) 40 MG tablet Take 40 mg by mouth 2 (two) times daily.    . propranolol (INDERAL) 20 MG tablet Take 1 tablet (20 mg  total) by mouth 3 (three) times daily as needed (Palpitations). 90 tablet 1  . SUMAtriptan (IMITREX) 100 MG tablet Take by mouth.    Marland Kitchen tiZANidine (ZANAFLEX) 4 MG tablet Take by mouth.    . topiramate (TOPAMAX) 100 MG tablet     . traZODone (DESYREL) 100 MG tablet Take 200 mg by mouth at bedtime as needed.    . zonisamide (ZONEGRAN) 100 MG capsule     . acetaminophen (TYLENOL) 325 MG tablet Take 650 mg by mouth every 6 (six) hours as needed. (Patient not taking: No sig reported)    . amoxicillin (AMOXIL) 875 MG tablet Take 875 mg by mouth 2 (two) times daily. (Patient not taking: Reported on 06/28/2020)    . ondansetron (ZOFRAN-ODT) 4 MG disintegrating tablet Take 4 mg by mouth every 8 (eight) hours as needed. (Patient not taking: No sig reported)    . promethazine (PHENERGAN) 25 MG tablet Take 1 tablet (25 mg total) by mouth every 6 (six) hours as needed for nausea or vomiting. (Patient not taking: No sig reported) 10 tablet 0   No current facility-administered medications for this visit.   Allergies: No Known Allergies Social History: Social History   Socioeconomic History  . Marital status: Married  Spouse name: Not on file  . Number of children: 2  . Years of education: Not on file  . Highest education level: Not on file  Occupational History  . Occupation: Bank Teller  Tobacco Use  . Smoking status: Never Smoker  . Smokeless tobacco: Never Used  Vaping Use  . Vaping Use: Never used  Substance and Sexual Activity  . Alcohol use: Yes    Alcohol/week: 0.0 standard drinks  . Drug use: No  . Sexual activity: Yes    Birth control/protection: None  Other Topics Concern  . Not on file  Social History Narrative   Averages 9 hours of sleep per night   6 people living in the home 2 young children    2 indoor dogs      Forensic psychologist    g2 p2 single    Social Determinants of Health   Financial Resource Strain: Not on file  Food Insecurity: Not on file  Transportation  Needs: Not on file  Physical Activity: Not on file  Stress: Not on file  Social Connections: Not on file   Lives in a 32 year old house. Smoking: denies Occupation: not employed  Environmental HistoryFreight forwarder in the house: no Charity fundraiser in the family room: no Carpet in the bedroom: no Heating: electric Cooling: central Pet: no  Family History: Family History  Problem Relation Age of Onset  . Lung cancer Father        non smoker raised with ets   . Hypertension Father   . Hyperlipidemia Father   . Asthma Father   . Diabetes Maternal Aunt   . Anemia Maternal Aunt   . Diabetes Maternal Uncle   . Diabetes Maternal Grandmother   . Diabetes Maternal Grandfather   . Stroke Maternal Grandfather   . Heart disease Maternal Grandfather   . Diabetes Paternal Grandmother   . Lung cancer Paternal Grandfather        lung  . Cancer - Lung Cousin   . Thyroid disease Sister        ra1 ablation?  Marland Kitchen Anemia Sister   . Sleep apnea Mother   . Asthma Mother   . Allergic rhinitis Mother    Review of Systems  Constitutional: Negative for appetite change, chills, fever and unexpected weight change.  HENT: Positive for postnasal drip. Negative for congestion and rhinorrhea.   Eyes: Negative for itching.  Respiratory: Positive for cough and shortness of breath. Negative for chest tightness and wheezing.   Cardiovascular: Negative for chest pain.  Gastrointestinal: Negative for abdominal pain.  Genitourinary: Negative for difficulty urinating.  Skin: Negative for rash.  Allergic/Immunologic: Positive for environmental allergies. Negative for food allergies.   Objective: BP 122/72   Pulse 79   Resp 18   SpO2 99%  There is no height or weight on file to calculate BMI. Physical Exam Vitals and nursing note reviewed.  Constitutional:      Appearance: Normal appearance. She is well-developed.  HENT:     Head: Normocephalic and atraumatic.     Right Ear: Tympanic membrane and  external ear normal.     Left Ear: Tympanic membrane and external ear normal.     Nose: Nose normal.     Mouth/Throat:     Mouth: Mucous membranes are moist.     Pharynx: Oropharynx is clear.  Eyes:     Conjunctiva/sclera: Conjunctivae normal.  Cardiovascular:     Rate and Rhythm: Normal rate and regular rhythm.  Heart sounds: Normal heart sounds. No murmur heard. No friction rub. No gallop.   Pulmonary:     Effort: Pulmonary effort is normal.     Breath sounds: Normal breath sounds. No wheezing, rhonchi or rales.  Musculoskeletal:     Cervical back: Neck supple.  Skin:    General: Skin is warm.     Findings: No rash.  Neurological:     Mental Status: She is alert and oriented to person, place, and time.  Psychiatric:        Behavior: Behavior normal.    The plan was reviewed with the patient/family, and all questions/concerned were addressed.  It was my pleasure to see Cindy Dodson today and participate in her care. Please feel free to contact me with any questions or concerns.  Sincerely,  Rexene Alberts, DO Allergy & Immunology  [facility] office: [place] office: [phone]

## 2020-06-28 ENCOUNTER — Ambulatory Visit: Payer: BC Managed Care – PPO | Admitting: Allergy

## 2020-06-28 ENCOUNTER — Encounter: Payer: Self-pay | Admitting: Allergy

## 2020-06-28 ENCOUNTER — Other Ambulatory Visit: Payer: Self-pay

## 2020-06-28 VITALS — BP 122/72 | HR 79 | Resp 18

## 2020-06-28 DIAGNOSIS — T781XXD Other adverse food reactions, not elsewhere classified, subsequent encounter: Secondary | ICD-10-CM

## 2020-06-28 DIAGNOSIS — J3089 Other allergic rhinitis: Secondary | ICD-10-CM | POA: Insufficient documentation

## 2020-06-28 DIAGNOSIS — R12 Heartburn: Secondary | ICD-10-CM | POA: Insufficient documentation

## 2020-06-28 DIAGNOSIS — R059 Cough, unspecified: Secondary | ICD-10-CM

## 2020-06-28 DIAGNOSIS — T7819XD Other adverse food reactions, not elsewhere classified, subsequent encounter: Secondary | ICD-10-CM | POA: Insufficient documentation

## 2020-06-28 MED ORDER — FLUTICASONE FUROATE-VILANTEROL 100-25 MCG/INH IN AEPB: 1.0000 | INHALATION_SPRAY | Freq: Every day | RESPIRATORY_TRACT | 3 refills | Status: AC

## 2020-06-28 MED ORDER — AZELASTINE-FLUTICASONE 137-50 MCG/ACT NA SUSP: 1.0000 | Freq: Two times a day (BID) | NASAL | 5 refills | Status: AC

## 2020-06-28 MED ORDER — ALBUTEROL SULFATE HFA 108 (90 BASE) MCG/ACT IN AERS: 2.0000 | INHALATION_SPRAY | RESPIRATORY_TRACT | 2 refills | Status: AC | PRN

## 2020-06-28 NOTE — Assessment & Plan Note (Signed)
Currently on pantoprazole 40 mg twice a day and had recent EGD with palpitations.  Denies any prior diagnosis of Eoe.  Noted that toast, chips and cheeze it seem to cause esophageal discomfort and is concerned about possible food allergies.  Discussed with patient at length that skin prick testing and bloodwork (food IgE levels) check for IgE mediated reactions which her clinical presentation does not support. She would still like to pursue food allergy testing today to rule things out.  Today's skin testing was negative to select foods.   Recommend to follow up with GI if symptoms worsen.   Avoid foods that seem to bother you - toast, cheeze it.   Continue pantoprazole 40mg  twice a day.   Continue heartburn lifestyle and dietary restrictions.

## 2020-06-28 NOTE — Assessment & Plan Note (Addendum)
Mild rhinitis symptoms and ENT evaluation shows slight deviated septum. Had periorbital swelling after horse exposure - daughter rides horses.   Today's skin testing showed: Positive to grass, trees, horse, cat, mold.   Start environmental control measures as below.  May use over the counter antihistamines such as Zyrtec (cetirizine), Claritin (loratadine), Allegra (fexofenadine), or Xyzal (levocetirizine) daily as needed. Start dymista (fluticasone + azelastine nasal spray combination) 1 spray per nostril twice a day. If it's not covered let us know.   Nasal saline spray (i.e., Simply Saline) or nasal saline lavage (i.e., NeilMed) is recommended as needed and prior to medicated nasal sprays.

## 2020-06-28 NOTE — Assessment & Plan Note (Signed)
Coughing with posttussive emesis at night.  Occasionally has shortness of breath with postnasal drip for 1 year but worsening the last 4-5 months.  No prior inhaler use or asthma diagnosis.  Triggers include dust, fans and air conditioning.  No prior COVID-19 diagnosis.  Takes pantoprazole 40 mg twice a day.  Recently diagnosed with idiopathic intracranial hypertension.  Today's spirometry shows some restriction with no improvement in FEV1 post bronchodilator treatment.  Clinically feeling improved.  Today's skin testing showed: Positive to grass, trees, horse, cat, mold.  . The most common causes of chronic cough include the following: upper airway cough syndrome (UACS) which is caused by variety of rhinitis conditions; asthma; gastroesophageal reflux disease (GERD); chronic bronchitis from cigarette smoking or other inhaled environmental irritants; non-asthmatic eosinophilic bronchitis; and bronchiectasis.  . In prospective studies, these conditions have accounted for up to 94% of the causes of chronic cough in immunocompetent adults.   Given daily symptoms will treat with ICS/LABA inhaler and nasal spray first.  Daily controller medication(s): Start Breo 126mcg 1 puff daily and rinse mouth after each use.  Demonstrated proper use. May use albuterol rescue inhaler 2 puffs every 4 to 6 hours as needed for shortness of breath, chest tightness, coughing, and wheezing. May use albuterol rescue inhaler 2 puffs 5 to 15 minutes prior to strenuous physical activities. Monitor frequency of use.  Get spirometry at next visit.

## 2020-06-28 NOTE — Patient Instructions (Addendum)
Today's skin testing showed: Positive to grass, trees, horse, cat, mold.  Negative to select foods. Results given.  Coughing: . The most common causes of chronic cough include the following: upper airway cough syndrome (UACS) which is caused by variety of rhinitis conditions; asthma; gastroesophageal reflux disease (GERD); chronic bronchitis from cigarette smoking or other inhaled environmental irritants; non-asthmatic eosinophilic bronchitis; and bronchiectasis.  . In prospective studies, these conditions have accounted for up to 94% of the causes of chronic cough in immunocompetent adults.   Daily controller medication(s): Start Breo 130mcg 1 puff daily and rinse mouth after each use.  May use albuterol rescue inhaler 2 puffs every 4 to 6 hours as needed for shortness of breath, chest tightness, coughing, and wheezing. May use albuterol rescue inhaler 2 puffs 5 to 15 minutes prior to strenuous physical activities. Monitor frequency of use.  Coughing control goals:  Full participation in all desired activities (may need albuterol before activity) Albuterol use two times or less a week on average (not counting use with activity) Cough interfering with sleep two times or less a month Oral steroids no more than once a year No hospitalizations  Environmental allergies  Start environmental control measures as below.  May use over the counter antihistamines such as Zyrtec (cetirizine), Claritin (loratadine), Allegra (fexofenadine), or Xyzal (levocetirizine) daily as needed. Start dymista (fluticasone + azelastine nasal spray combination) 1 spray per nostril twice a day. If it's not covered let us know.   Nasal saline spray (i.e., Simply Saline) or nasal saline lavage (i.e., NeilMed) is recommended as needed and prior to medicated nasal sprays.  GI:  If you had more GI symptoms please follow up with your GI doctor.   Avoid foods that seem to bother you - toast, cheeze it.   Continue  pantoprazole 40mg  twice a day.   Continue lifestyle and dietary restrictions.   Follow up in 2 months or sooner if needed.   Reducing Pollen Exposure . Pollen seasons: trees (spring), grass (summer) and ragweed/weeds (fall). Marland Kitchen Keep windows closed in your home and car to lower pollen exposure.  Susa Simmonds air conditioning in the bedroom and throughout the house if possible.  . Avoid going out in dry windy days - especially early morning. . Pollen counts are highest between 5 - 10 AM and on dry, hot and windy days.  . Save outside activities for late afternoon or after a heavy rain, when pollen levels are lower.  . Avoid mowing of grass if you have grass pollen allergy. Marland Kitchen Be aware that pollen can also be transported indoors on people and pets.  . Dry your clothes in an automatic dryer rather than hanging them outside where they might collect pollen.  . Rinse hair and eyes before bedtime. Pet Allergen Avoidance: . Contrary to popular opinion, there are no "hypoallergenic" breeds of dogs or cats. That is because people are not allergic to an animal's hair, but to an allergen found in the animal's saliva, dander (dead skin flakes) or urine. Pet allergy symptoms typically occur within minutes. For some people, symptoms can build up and become most severe 8 to 12 hours after contact with the animal. People with severe allergies can experience reactions in public places if dander has been transported on the pet owners' clothing. Marland Kitchen Keeping an animal outdoors is only a partial solution, since homes with pets in the yard still have higher concentrations of animal allergens. . Before getting a pet, ask your allergist to determine if you  are allergic to animals. If your pet is already considered part of your family, try to minimize contact and keep the pet out of the bedroom and other rooms where you spend a great deal of time. . As with dust mites, vacuum carpets often or replace carpet with a hardwood  floor, tile or linoleum. . High-efficiency particulate air (HEPA) cleaners can reduce allergen levels over time. . While dander and saliva are the source of cat and dog allergens, urine is the source of allergens from rabbits, hamsters, mice and [place] pigs; so ask a non-allergic family member to clean the animal's cage. . If you have a pet allergy, talk to your allergist about the potential for allergy immunotherapy (allergy shots). This strategy can often provide long-term relief. Mold Control . Mold and fungi can grow on a variety of surfaces provided certain temperature and moisture conditions exist.  . Outdoor molds grow on plants, decaying vegetation and soil. The major outdoor mold, Alternaria and Cladosporium, are found in very high numbers during hot and dry conditions. Generally, a late summer - fall peak is seen for common outdoor fungal spores. Rain will temporarily lower outdoor mold spore count, but counts rise rapidly when the rainy period ends. . The most important indoor molds are Aspergillus and Penicillium. Dark, humid and poorly ventilated basements are ideal sites for mold growth. The next most common sites of mold growth are the bathroom and the kitchen. Outdoor (Seasonal) Mold Control . Use air conditioning and keep windows closed. . Avoid exposure to decaying vegetation. Marland Kitchen Avoid leaf raking. . Avoid grain handling. . Consider wearing a face mask if working in moldy areas.  Indoor (Perennial) Mold Control  . Maintain humidity below 50%. . Get rid of mold growth on hard surfaces with water, detergent and, if necessary, 5% bleach (do not mix with other cleaners). Then dry the area completely. If mold covers an area more than 10 square feet, consider hiring an indoor environmental professional. . For clothing, washing with soap and water is best. If moldy items cannot be cleaned and dried, throw them away. . Remove sources e.g. contaminated carpets. . Repair and seal leaking  roofs or pipes. Using dehumidifiers in damp basements may be helpful, but empty the water and clean units regularly to prevent mildew from forming. All rooms, especially basements, bathrooms and kitchens, require ventilation and cleaning to deter mold and mildew growth. Avoid carpeting on concrete or damp floors, and storing items in damp areas.   Heartburn Heartburn is a type of pain or discomfort that can happen in your throat or chest. It is often described as a burning pain. It may also cause a bad, acid-like taste in your mouth. It may be caused by stomach contents that move back up (reflux) into the part of the body that moves food from your mouth to your stomach (esophagus). Heartburn may feel worse:  When you lie down.  When you bend over.  At night. Follow these instructions at home: Eating and drinking  Avoid certain foods and drinks as told by your doctor. This may include: ? Coffee and tea, with or without caffeine. ? Drinks that have alcohol. ? Energy drinks and sports drinks. ? Carbonated drinks or sodas. ? Chocolate and cocoa. ? Peppermint and mint flavorings. ? Garlic and onions. ? Horseradish. ? Spicy and acidic foods, such as:  Peppers.  Chili powder and curry powder.  Vinegar.  Hot sauces and BBQ sauce. ? Citrus fruit juices and citrus fruits,  such as:  Oranges.  Lemons.  Limes. ? Tomato-based foods, such as:  Red sauce and pizza with red sauce.  Chili.  Salsa. ? Fried and fatty foods, such as:  Donuts.  [place] fries and potato chips.  High-fat dressings. ? High-fat meats, such as:  Hot dogs and sausage.  Rib eye steak.  Ham and bacon. ? High-fat dairy items, such as:  Whole milk.  Butter.  Cream cheese.  Eat small meals often. Avoid eating large meals.  Avoid drinking large amounts of liquid with your meals.  Avoid eating meals during the 2-3 hours before bedtime.  Avoid lying down right after you eat.  Do not exercise  right after you eat.   Lifestyle  If you are overweight, lose an amount of weight that is healthy for you. Ask your doctor about a safe weight loss goal.  Do not smoke or use any products that contain nicotine or tobacco. These can make your symptoms worse. If you need help quitting, ask your doctor.  Wear loose clothes. Do not wear anything tight around your waist.  Raise (elevate) the head of your bed about 6 inches (15 cm) when you sleep. You can use a wedge to do this.  Try to lower your stress. If you need help doing this, ask your doctor.      Medicines  Take over-the-counter and prescription medicines only as told by your doctor.  Do not take aspirin or NSAIDs, such as ibuprofen, unless your doctor says it is okay.  Stop medicines only as told by your doctor. If you stop taking some medicines too quickly, your symptoms may get worse. General instructions  Watch for any changes in your symptoms.  Keep all follow-up visits. Contact a doctor if:  You have new symptoms.  You lose weight and you do not know why.  You have trouble swallowing, or it hurts to swallow.  You have wheezing or a cough that keeps happening.  Your symptoms do not get better with treatment.  You have heartburn often for more than 2 weeks. Get help right away if:  You have pain in your arms, neck, jaw, teeth, or back all of a sudden.  You feel sweaty, dizzy, or light-headed all of a sudden.  You have chest pain or shortness of breath.  You vomit and your vomit looks like blood or coffee grounds.  Your poop (stool) is bloody or black. These symptoms may be an emergency. Get help right away. Call your local emergency services (911 in the [place]  Do not wait to see if the symptoms will go away.  Do not drive yourself to the hospital. Summary  Heartburn is a type of pain that can happen in your throat or chest. It can feel like a burning pain. It may also cause a bad, acid-like taste in  your mouth.  You may need to avoid certain foods and drinks to help your symptoms. Ask your doctor what foods and drinks you should avoid.  Take over-the-counter and prescription medicines only as told by your doctor. Do not take aspirin or NSAIDs, such as ibuprofen, unless your doctor told you to do so.  Contact your doctor if your symptoms do not get better or they get worse. This information is not intended to replace advice given to you by your health care provider. Make sure you discuss any questions you have with your health care provider. Document Revised: 11/02/2019 Document Reviewed: 11/02/2019 Elsevier Patient Education  2021 [employer]  Inc.  

## 2020-07-05 ENCOUNTER — Telehealth: Payer: Self-pay

## 2020-07-05 MED ORDER — AZELASTINE HCL 0.1 % NA SOLN: 1.0000 | Freq: Two times a day (BID) | NASAL | 5 refills | Status: AC | PRN

## 2020-07-05 MED ORDER — FLUTICASONE PROPIONATE 50 MCG/ACT NA SUSP: 1.0000 | Freq: Two times a day (BID) | NASAL | 5 refills | Status: AC | PRN

## 2020-07-05 NOTE — Telephone Encounter (Addendum)
Received a fax that Azelastine/Fluticasone 137-50 mcg is not covered. Per Dr. Maudie Mercury Send in Flonase 1 spray twice a day and Azelastine 1-2 sprays twice a day. Left a detailed message informing patient of this change.

## 2020-12-13 ENCOUNTER — Other Ambulatory Visit: Payer: Self-pay

## 2020-12-13 ENCOUNTER — Telehealth: Payer: Self-pay | Admitting: Cardiology

## 2020-12-13 DIAGNOSIS — R002 Palpitations: Secondary | ICD-10-CM

## 2020-12-13 MED ORDER — PROPRANOLOL HCL 20 MG PO TABS: 20.0000 mg | ORAL_TABLET | Freq: Three times a day (TID) | ORAL | 1 refills | Status: AC | PRN

## 2021-04-05 IMAGING — US US PELVIS COMPLETE
1 series · 13 of 25 positions shown · non-contrast
Comparison: None available.

CLINICAL DATA: Initial evaluation for acute onset left lower
quadrant pain.

EXAM:
TRANSABDOMINAL AND TRANSVAGINAL ULTRASOUND OF PELVIS
DOPPLER ULTRASOUND OF OVARIES
TECHNIQUE: Both transabdominal and transvaginal ultrasound examinations of the
pelvis were performed. Transabdominal technique was performed for
global imaging of the pelvis including uterus, ovaries, adnexal
regions, and pelvic cul-de-sac.
It was necessary to proceed with endovaginal exam following the
transabdominal exam to visualize the uterus, endometrium, and
ovaries. Color and duplex Doppler ultrasound was utilized to
evaluate blood flow to the ovaries.

[Series 1: us pelvis complete · 13 of 44 slices shown]
[im 1/44]
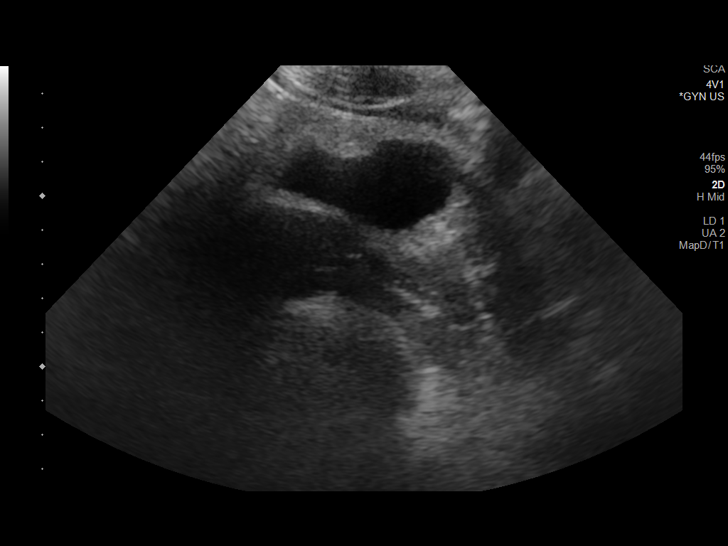
[im 4/44]
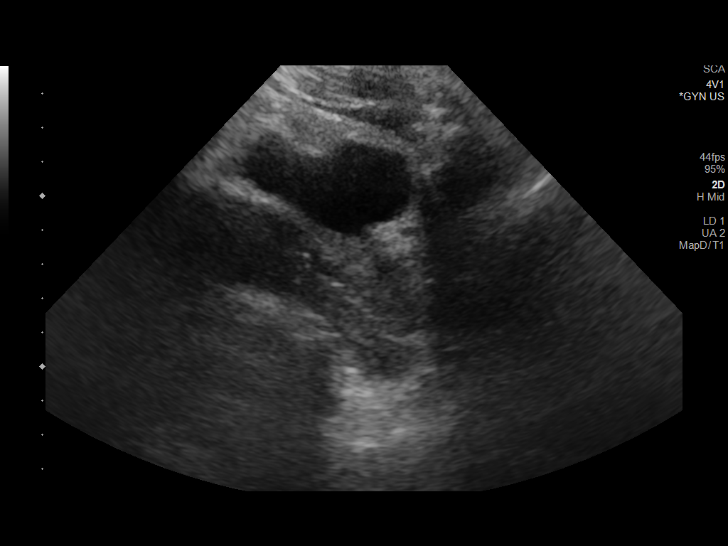
[im 8/44]
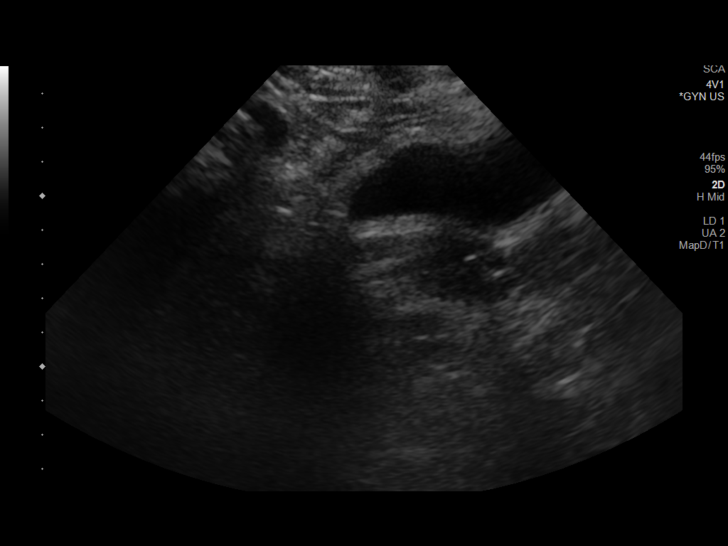
[im 11/44]
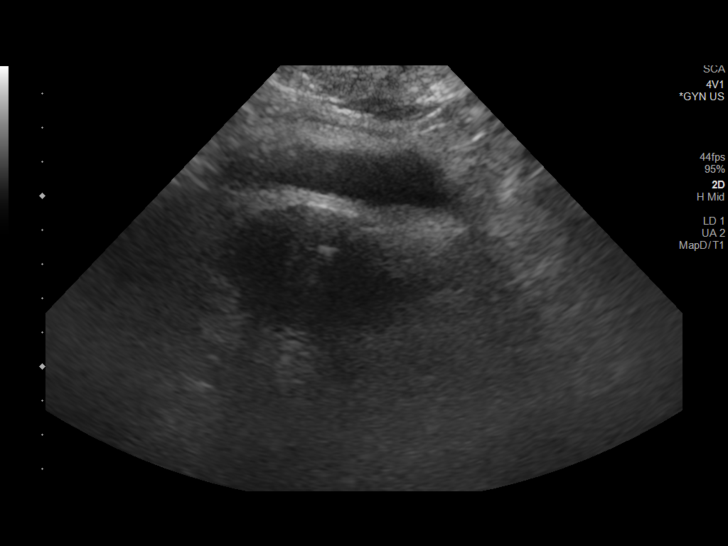
[im 15/44]
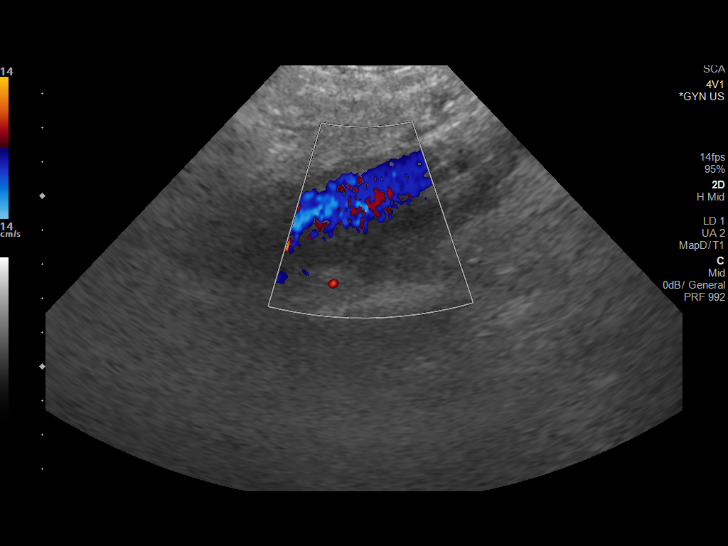
[im 18/44]
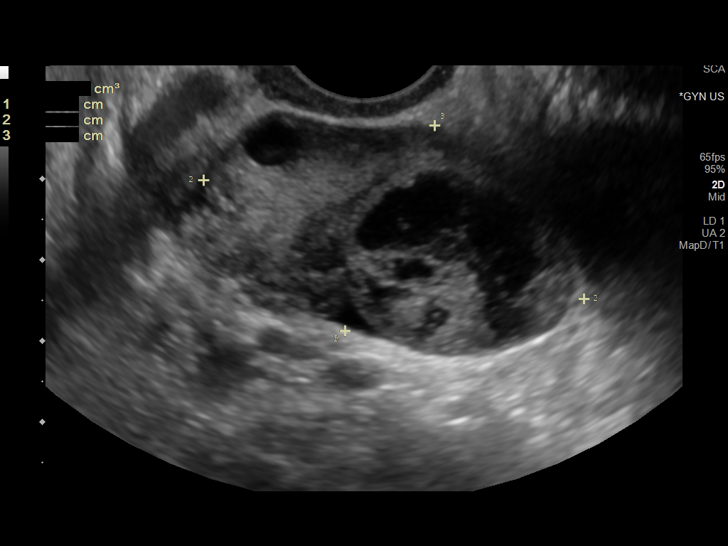
[im 22/44]
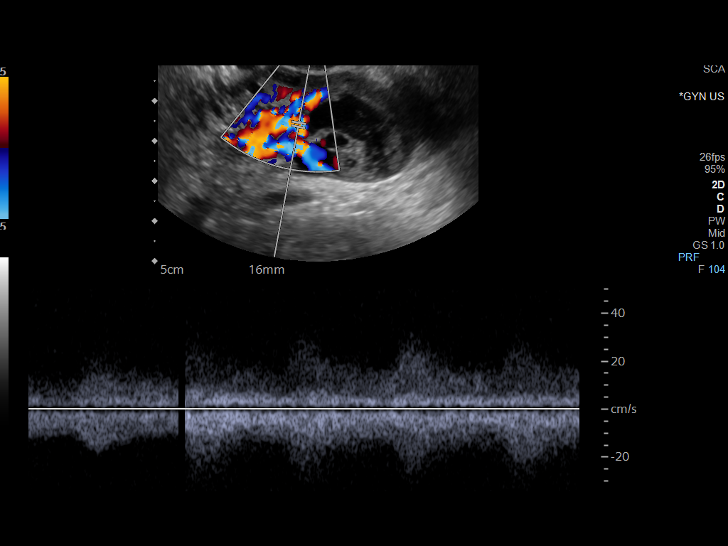
[im 26/44]
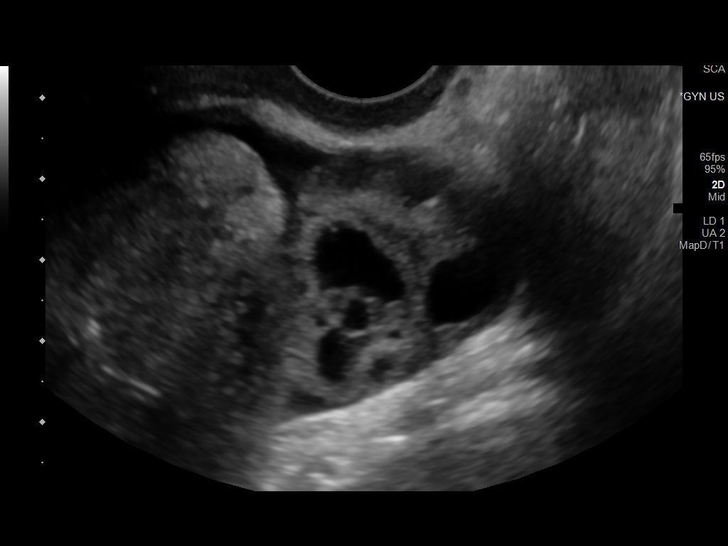
[im 29/44]
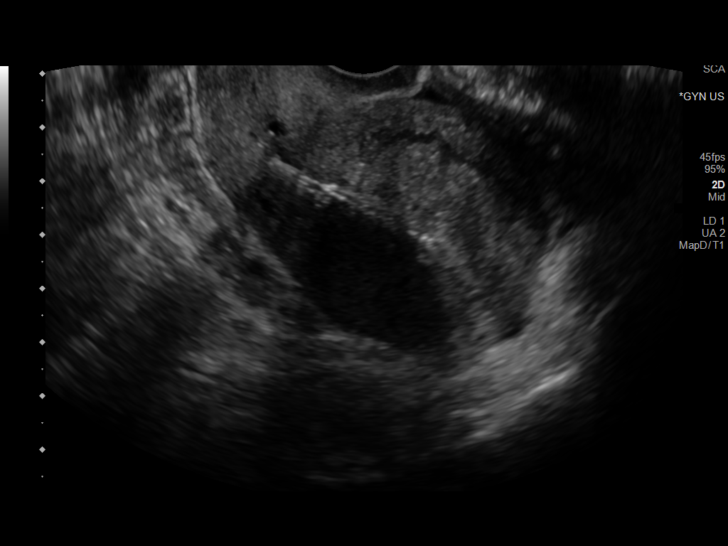
[im 33/44]
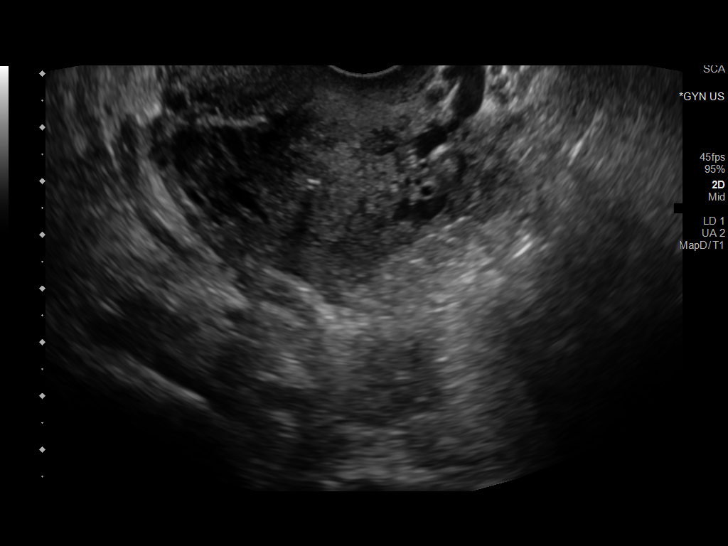
[im 36/44]
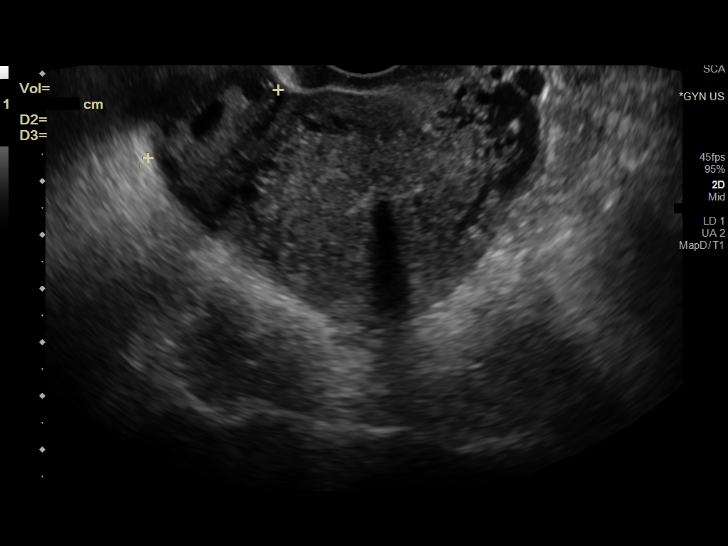
[im 40/44]
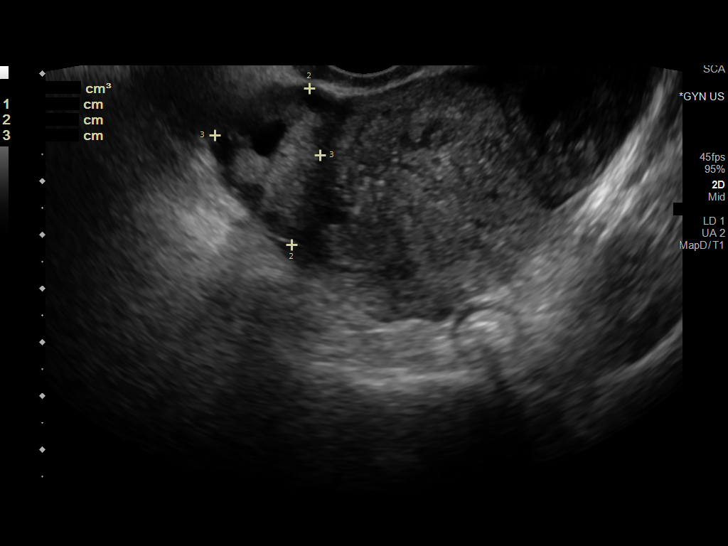
[im 44/44]
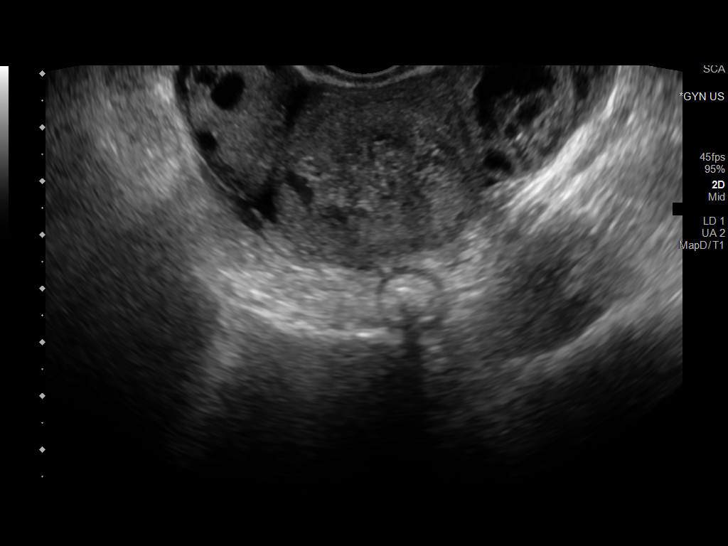

[13 of 25 positions shown; findings below may reference images not displayed]

FINDINGS: Uterus

Measurements: 7.3 x 3.9 x 5.1 cm = volume: 70.6 mL. No fibroids or
other mass visualized.

Endometrium

Endometrial thickness not well assessed due to adjacent IUD. IUD
appears to be in appropriate position within the endometrial cavity.
No focal abnormality.

Right ovary

Measurements: 2.7 x 2.9 x 1.9 cm = volume: 8.4 mL. Normal
appearance/no adnexal mass.

Left ovary

Measurements: 4.6 x 9.4 x 2.7 cm = volume: 32.8 mL. 2.9 cm complex
cystic lesion, indeterminate, but suspected to reflect either a
degenerating corpus luteal cyst or possibly hemorrhagic cyst.

Pulsed Doppler evaluation of both ovaries demonstrates normal
low-resistance arterial and venous waveforms.

Other findings

Small volume free fluid within the pelvis.
IMPRESSION: 1. 2.9 cm complex left ovarian cystic lesion, indeterminate, but
favored to reflect either a hemorrhagic cyst or degenerating corpus
luteal cyst. Follow-up ultrasound in 6-12 weeks to ensure resolution
is suggested as clinically warranted.
2. Associated small volume free physiologic fluid within the pelvis.
3. No other acute abnormality.  No evidence for ovarian torsion.
4. IUD in appropriate position within the endometrial cavity.

## 2021-04-05 IMAGING — CT CT ABDOMEN AND PELVIS WITH CONTRAST
2 of 4 series · 16 of 46 positions shown, 18 images · IV contrast (APPLIED)
Comparison: Pelvic ultrasound earlier this day. Abdominopelvic CT
07/18/2013

CLINICAL DATA: Left lower quadrant pain.

EXAM:
CT ABDOMEN AND PELVIS WITH CONTRAST
TECHNIQUE: Multidetector CT imaging of the abdomen and pelvis was performed
using the standard protocol following bolus administration of
intravenous contrast.
CONTRAST:  100mL OMNIPAQUE IOHEXOL 350 MG/ML SOLN

[Series 3: abdomen 5.0 · axial · 0.90mm/px · z∈[+713,+1118]mm · 13 of 91 slices shown, 15 images]
[im 5/91  soft-tissue]
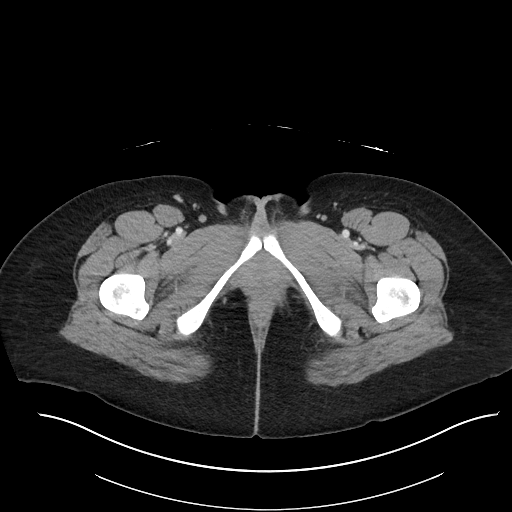
[im 5/91  bone]
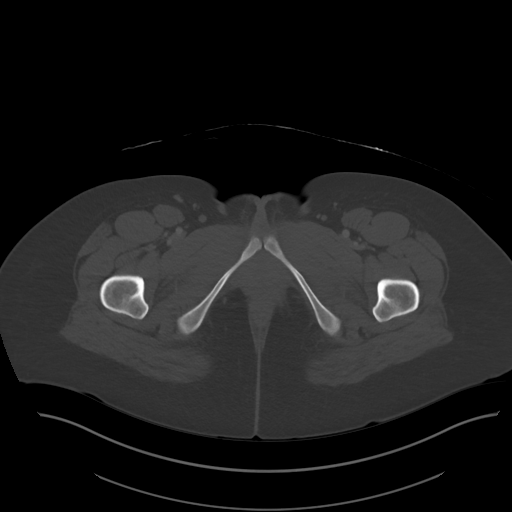
[im 14/91  soft-tissue]
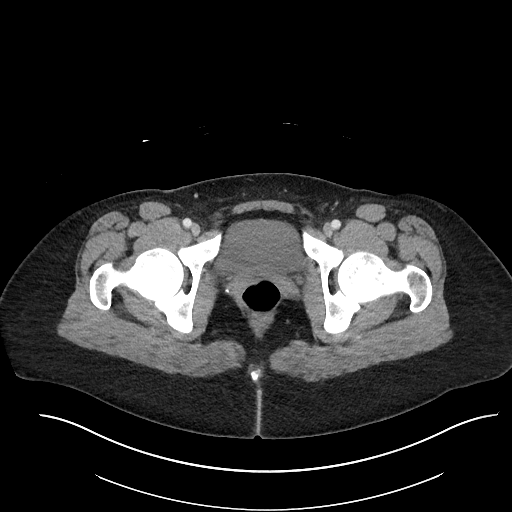
[im 19/91  soft-tissue]
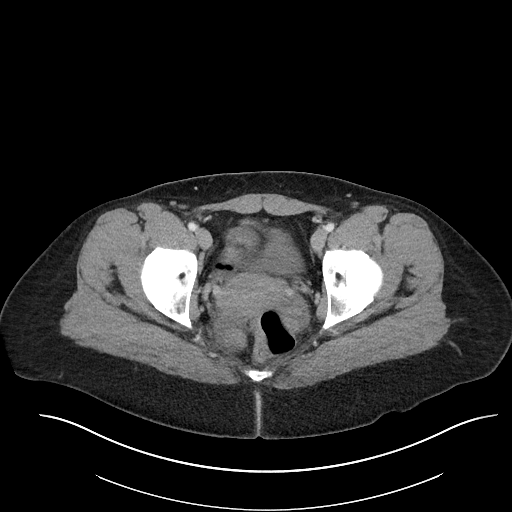
[im 28/91  soft-tissue]
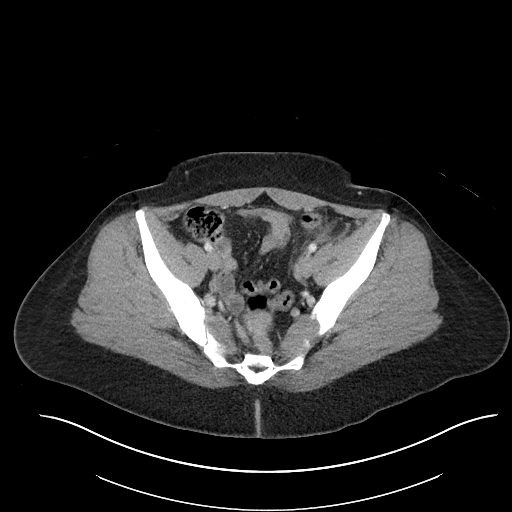
[im 32/91  soft-tissue]
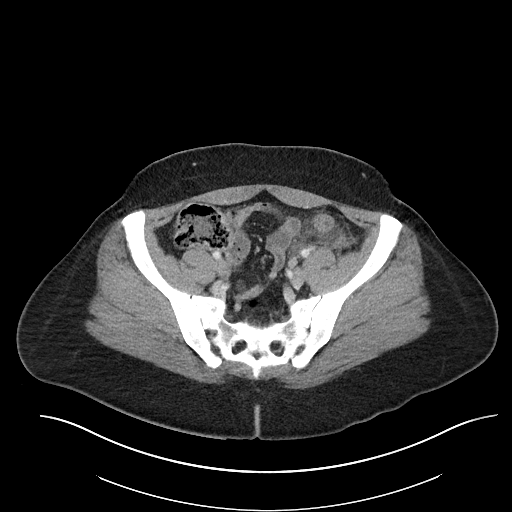
[im 41/91  soft-tissue]
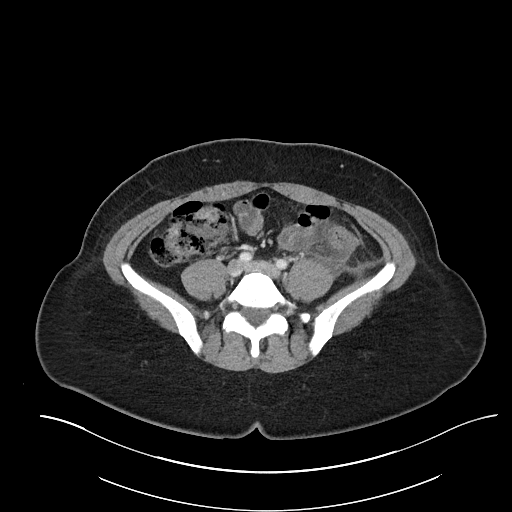
[im 46/91  soft-tissue]
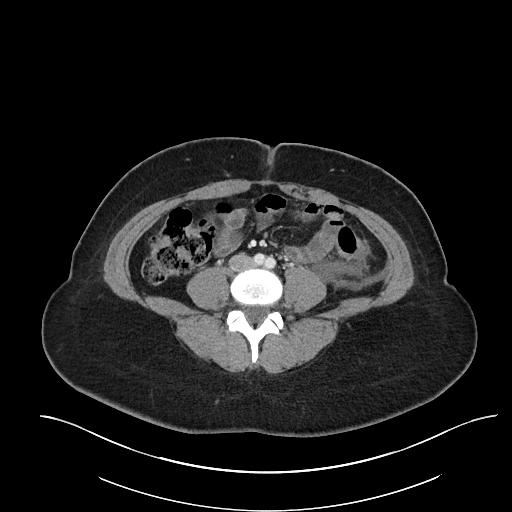
[im 50/91  soft-tissue]
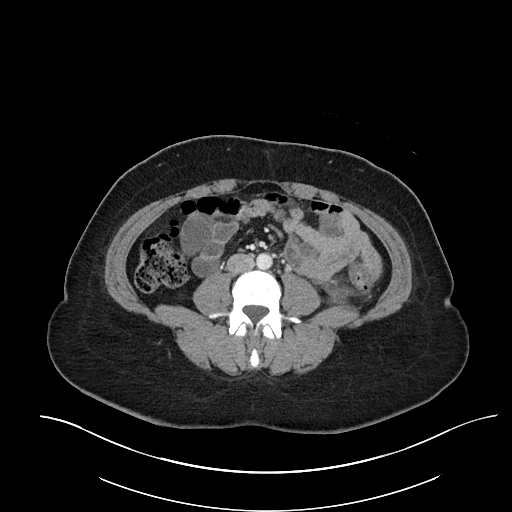
[im 59/91  soft-tissue]
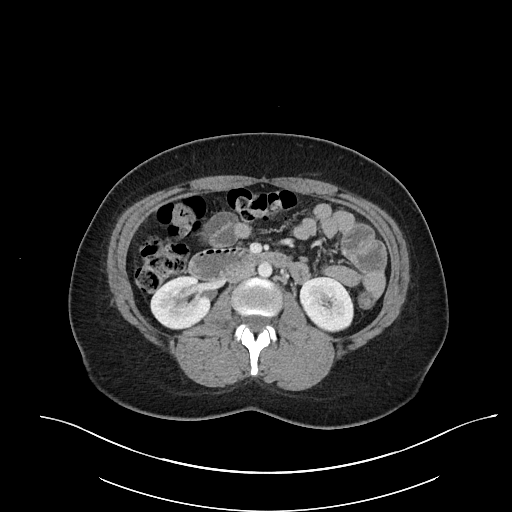
[im 59/91  bone]
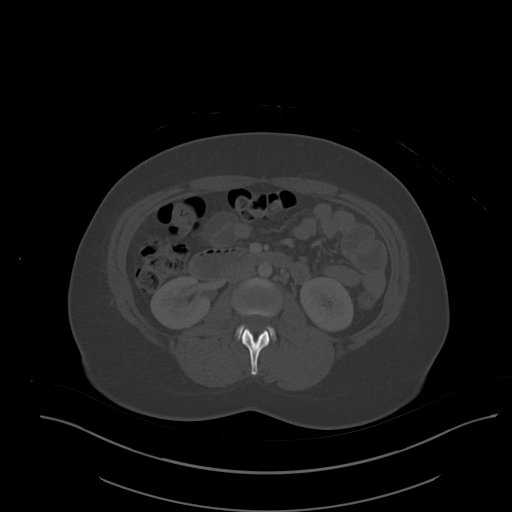
[im 64/91  soft-tissue]
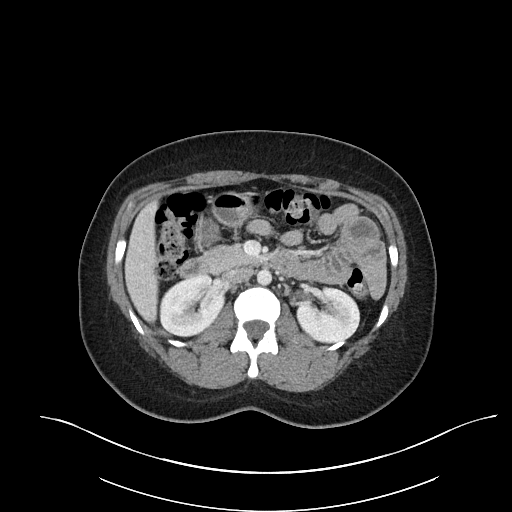
[im 73/91  soft-tissue]
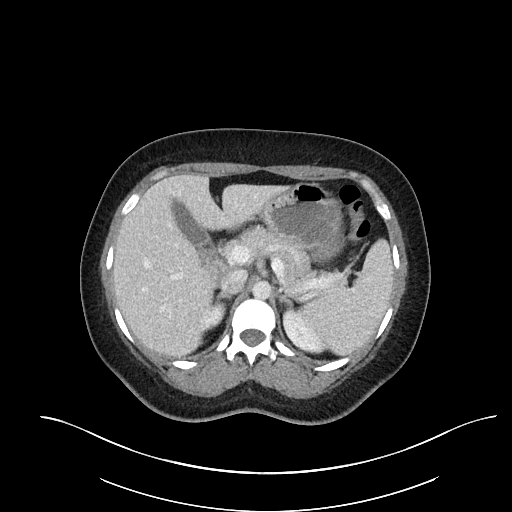
[im 77/91  soft-tissue]
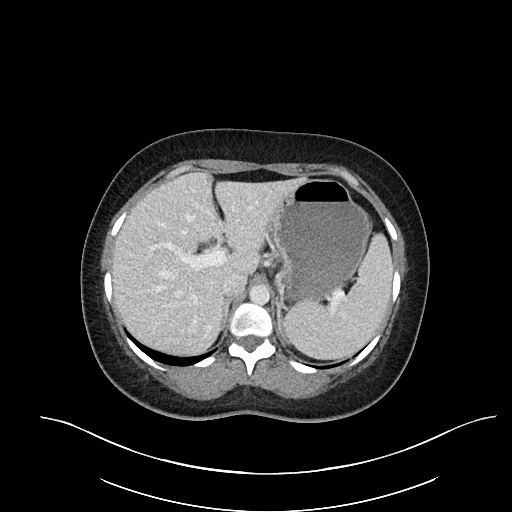
[im 86/91  soft-tissue]
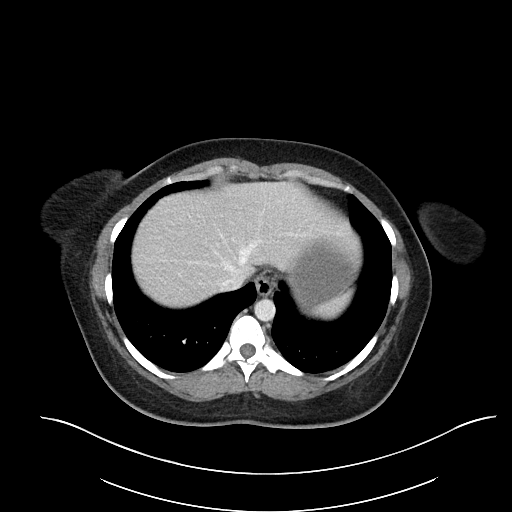

[Series 6: abdomen 3.0 mpr cor · coronal · 0.88mm/px · 3 of 101 slices shown]
[im 34/101  soft-tissue]
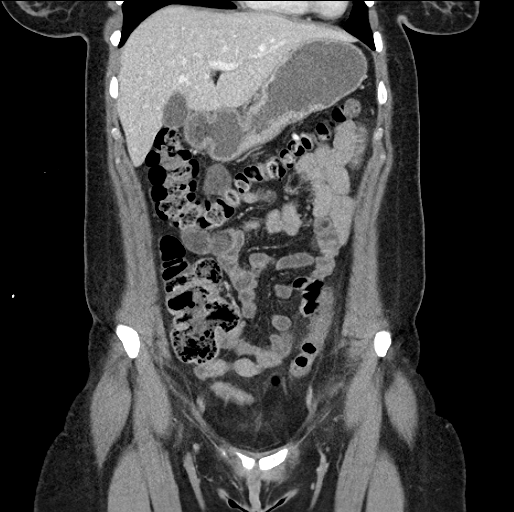
[im 45/101  soft-tissue]
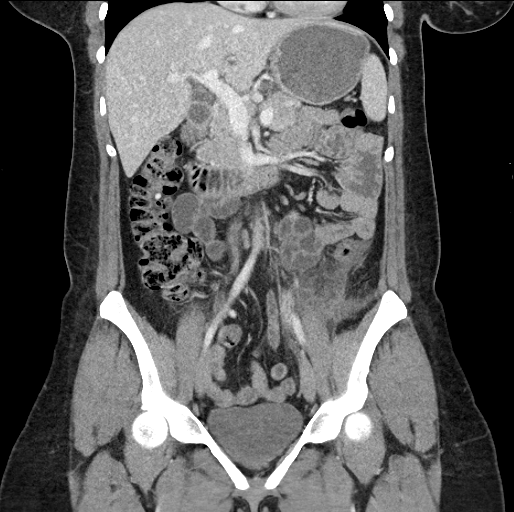
[im 56/101  soft-tissue]
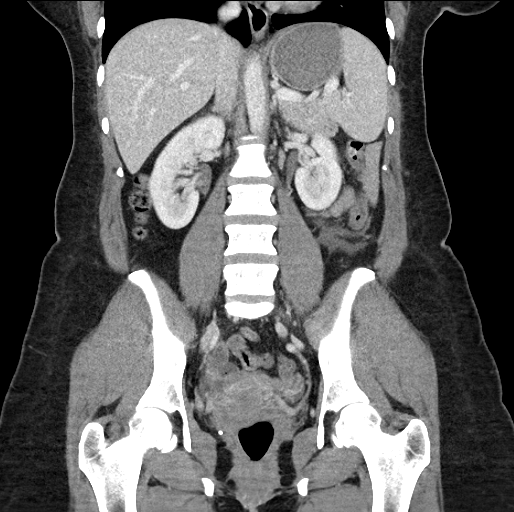

[16 of 46 positions shown; findings below may reference images not displayed]

FINDINGS: Lower chest: Lung bases are clear.

Hepatobiliary: 7 mm cyst in the right lobe of the liver. No
suspicious hepatic lesion. Gallbladder physiologically distended, no
calcified stone. No biliary dilatation.

Pancreas: No ductal dilatation or inflammation.

Spleen: Normal in size without focal abnormality.

Adrenals/Urinary Tract: Normal adrenal glands. 4 mm nonobstructing
stone in the lower right kidney. No hydronephrosis or perinephric
edema. Urinary bladder is partially distended.

Stomach/Bowel: Moderate length segment of colonic wall thickening
extending from the distal descending to the proximal sigmoid colon.
Associated pericolonic edema and fat stranding with small amount of
free fluid. There a few scattered diverticula in this region but no
focal inflamed diverticulitis. No perforation or abscess. Small
volume of stool in the remainder the colon. The appendix courses
into the central pelvis and is normal. No small bowel dilatation,
obstruction, or inflammation. Bowel loops in the left lower quadrant
in the region of colonic inflammation are fluid-filled and slightly
prominent, likely reactive.

Vascular/Lymphatic: Normal caliber abdominal aorta. Portal vein and
mesenteric vessels are patent. No abdominopelvic adenopathy.

Reproductive: Retroverted uterus with IUD in place. Complex cyst in
the left ovary as characterized on ultrasound.

Other: Small amount of free fluid in the left pericolic gutter and
in the pelvis. No free air or intra-abdominal abscess.

Musculoskeletal: There are no acute or suspicious osseous
abnormalities.
IMPRESSION: 1. Colitis involving the distal descending and proximal sigmoid
colon, may be infectious or inflammatory. This is slightly more
distal than the colonic inflammation on 8008 CT. There are few
scattered diverticula in this region, however length of colonic
involvement favors colitis over diverticulitis.
2. Nonobstructing right renal stone.

## 2021-08-26 ENCOUNTER — Institutional Professional Consult (permissible substitution): Payer: Self-pay | Admitting: Adult Health

## 2022-02-10 ENCOUNTER — Ambulatory Visit: Payer: Self-pay | Admitting: Skilled Nursing Facility1

## 2022-02-14 ENCOUNTER — Encounter: Payer: Self-pay | Attending: Physician Assistant | Admitting: Dietician

## 2022-02-14 ENCOUNTER — Encounter: Payer: Self-pay | Admitting: Dietician

## 2022-02-14 DIAGNOSIS — E669 Obesity, unspecified: Secondary | ICD-10-CM | POA: Insufficient documentation

## 2022-02-14 DIAGNOSIS — Z713 Dietary counseling and surveillance: Secondary | ICD-10-CM | POA: Insufficient documentation

## 2022-02-14 DIAGNOSIS — N189 Chronic kidney disease, unspecified: Secondary | ICD-10-CM | POA: Insufficient documentation

## 2022-02-14 DIAGNOSIS — I129 Hypertensive chronic kidney disease with stage 1 through stage 4 chronic kidney disease, or unspecified chronic kidney disease: Secondary | ICD-10-CM | POA: Insufficient documentation

## 2022-02-14 NOTE — Progress Notes (Signed)
Medical Nutrition Therapy  Appointment Start time:  [phone]  Appointment End time:  1100  Primary concerns today: Pt states she's always struggled with her weight. Pt reports history of losing and gaining weight throughout her life and going on diets. Pt states she once was overly obsessive with exercise and wants a better relationship with it.     Referral diagnosis: E66.01 Preferred learning style: no preference indicated Learning readiness: ready   NUTRITION ASSESSMENT   Anthropometrics  Ht: 64in Wt: 253.7lbs  Clinical Medical Hx: anemia, GERD, kidney disease, HTN, diverticulitis Medications: reviewed Labs: reviewed Notable Signs/Symptoms: GI issues: bloating when eating fruit, GERD.   Lifestyle & Dietary Hx  Pt states she has family history of type 1 and type 2 diabetes and wants to prevent this for herself and her kids.  Pt is concerned she has sleep apnea due to recent snoring and difficulty breathing at night. Pt plans to see sleep specialist.   Pt lives with 2 kids and husband and mom. Husband does the cooking. Pt states her mom walks daily and she is considering walking with her mom.   Hx of weight cycling and yo-yo dieting. Hx of excessive exercise.   Pt states acid reflux has been worse.   Estimated daily fluid intake: 48 oz Supplements: vitamin D Sleep: 8 hours Stress / self-care: moderate stress Current average weekly physical activity: ADLs  24-Hr Dietary Recall First Meal: chia seed pudding OR whole grain toast with egg and avocado Snack: none Second Meal: spring mix salad with crispy chicken, croutons, and feta with olive oil sprits dressing with chobani yogurt flip Snack: none Third Meal: small dinner (chicken alfredo and salad) OR not hungry Snack: chia seed pudding OR cereal Beverages: coffee with creamer, water, green tea, occasional diluted pomegranate juice, hot tea with honey,    NUTRITION DIAGNOSIS  NB-1.1 Food and nutrition-related knowledge  deficit As related to E66.01.  As evidenced by pt report and diet hx.   NUTRITION INTERVENTION  Nutrition education (E-1) on the following topics:  Omega 3's and unsaturated fats Building balanced meals MyPlate GERD tips and foods to limit Limiting sodium Importance of physical activity on health Functions of fiber Whole vs refined grains Yo-yo dieting, weight cycling, and set weight  Handouts Provided Include  Nutrition Care Manual: GERD Nutrition Therapy Dish Up a Healthy Meal Nutrition Care Manual: Hypertension Nutrition Therapy  Learning Style & Readiness for Change Teaching method utilized: Visual & Auditory  Demonstrated degree of understanding via: Teach Back  Barriers to learning/adherence to lifestyle change: none  Goals Established by Pt Aim for 150 minutes of physical activity weekly.  Aim to make 1/2 of your plate fruits and vegetables as often as possible.  Eat more Non-Starchy Vegetables  Minimize added sugars and refined grains Rethink what you drink. Choose beverages without added sugar. Look for 0 carbs on the label. Choose whole wheat and whole grain products.  Choose whole foods over processed.  Make simple meals at home more often than eating out.  For GERD: Avoid acidic foods and spicy foods. Avoid laying down after meals. Eat slowly and chew foods well.    MONITORING & EVALUATION Dietary intake, weekly physical activity, and follow up in 2 months.  Next Steps  Patient is to call for questions.

## 2022-02-14 NOTE — Patient Instructions (Addendum)
Aim for 150 minutes of physical activity weekly.  Aim to make 1/2 of your plate fruits and vegetables as often as possible.  Eat more Non-Starchy Vegetables  Minimize added sugars and refined grains Rethink what you drink. Choose beverages without added sugar. Look for 0 carbs on the label. Choose whole wheat and whole grain products.  Choose whole foods over processed.  Make simple meals at home more often than eating out.  For GERD: Avoid acidic foods and spicy foods. Avoid laying down after meals. Eat slowly and chew foods well.

## 2022-04-17 ENCOUNTER — Ambulatory Visit: Payer: Self-pay | Admitting: Dietician

## 2022-06-10 ENCOUNTER — Ambulatory Visit: Payer: 59 | Admitting: Plastic Surgery

## 2022-06-10 ENCOUNTER — Encounter: Payer: Self-pay | Admitting: Plastic Surgery

## 2022-06-10 VITALS — BP 130/85 | HR 103 | Ht 64.0 in | Wt 256.0 lb

## 2022-06-10 DIAGNOSIS — Z6841 Body Mass Index (BMI) 40.0 and over, adult: Secondary | ICD-10-CM

## 2022-06-10 DIAGNOSIS — M542 Cervicalgia: Secondary | ICD-10-CM

## 2022-06-10 DIAGNOSIS — M549 Dorsalgia, unspecified: Secondary | ICD-10-CM | POA: Insufficient documentation

## 2022-06-10 DIAGNOSIS — N62 Hypertrophy of breast: Secondary | ICD-10-CM

## 2022-06-10 DIAGNOSIS — M546 Pain in thoracic spine: Secondary | ICD-10-CM | POA: Diagnosis not present

## 2022-06-10 DIAGNOSIS — G8929 Other chronic pain: Secondary | ICD-10-CM

## 2022-06-10 DIAGNOSIS — R200 Anesthesia of skin: Secondary | ICD-10-CM

## 2022-06-10 DIAGNOSIS — Z7969 Long term (current) use of other immunomodulators and immunosuppressants: Secondary | ICD-10-CM

## 2022-06-10 NOTE — Progress Notes (Signed)
Patient ID: Cindy Dodson, female    DOB: 1990/07/06, 32 y.o.   MRN: [id number]   Chief Complaint  Patient presents with   Consult        Breast Problem    Mammary Hyperplasia: The patient is a 32 y.o. female with a history of mammary hyperplasia for several years.  She has extremely large breasts causing symptoms that include the following: Back pain in the upper and lower back, including neck pain. She pulls or pins her bra straps to provide better lift and relief of the pressure and pain. She notices relief by holding her breast up manually.  Her shoulder straps cause grooves and pain and pressure that requires padding for relief. Pain medication is sometimes required with motrin and tylenol.  Activities that are hindered by enlarged breasts include: exercise and running.  She has tried supportive clothing as well as fitted bras without improvement.  Her breasts are extremely large and the left is larger than the right.  She has hyperpigmentation of the inframammary area on both sides.  The sternal to nipple distance on the right is 39 cm and the left is 43 cm.  The IMF distance is 20 cm.  She is 5 feet 4 inches tall and weighs 253 pounds.  The BMI = 43.4 kg/m.  Preoperative bra size = E cup. She would like to be a B/C cup.  The estimated excess breast tissue to be removed at the time of surgery = 850 grams on the left and 850 grams on the right.  Mammogram history: none.  Family history of breast cancer:  none.  Tobacco use:  none.   The patient expresses the desire to pursue surgical intervention.  The patient has several concerning medical issues.  She has rheumatoid arthritis.  She takes an immunosuppressive medication for that.  She recently sprained her left ankle which is healing slowly.  Probably due to the medication.  She has diverticulitis and a history of keloid formation.  Her mother has keloids as well.  This is very concerning for her healing and we talked about  it.     Review of Systems  Constitutional: Negative.   HENT: Negative.    Eyes: Negative.   Respiratory: Negative.  Negative for chest tightness and shortness of breath.   Cardiovascular: Negative.   Gastrointestinal: Negative.   Endocrine: Negative.   Genitourinary: Negative.   Musculoskeletal:  Positive for back pain and neck pain.    Past Medical History:  Diagnosis Date   Anemia    microcytic hypochromic anemia   GERD (gastroesophageal reflux disease)    Hx of varicella    Hydronephrosis    Migraine     Past Surgical History:  Procedure Laterality Date   NO PAST SURGERIES        Current Outpatient Medications:    acetaminophen (TYLENOL) 325 MG tablet, Take 650 mg by mouth every 6 (six) hours as needed., Disp: , Rfl:    albuterol (VENTOLIN HFA) 108 (90 Base) MCG/ACT inhaler, Inhale 2 puffs into the lungs every 4 (four) hours as needed for wheezing or shortness of breath (coughing fits)., Disp: 18 g, Rfl: 2   azelastine (ASTELIN) 0.1 % nasal spray, Place 1-2 sprays into both nostrils 2 (two) times daily as needed for rhinitis., Disp: 30 mL, Rfl: 5   Azelastine-Fluticasone 137-50 MCG/ACT SUSP, Place 1 spray into the nose in the morning and at bedtime., Disp: 23 g, Rfl: 5   benazepril (  LOTENSIN) 10 MG tablet, Take 10 mg by mouth daily., Disp: , Rfl:    escitalopram (LEXAPRO) 10 MG tablet, Take 10 mg by mouth daily., Disp: , Rfl:    fluticasone (FLONASE) 50 MCG/ACT nasal spray, Place 1 spray into both nostrils 2 (two) times daily as needed for allergies or rhinitis., Disp: 16 g, Rfl: 5   fluticasone furoate-vilanterol (BREO ELLIPTA) 100-25 MCG/INH AEPB, Inhale 1 puff into the lungs daily. Rinse mouth after each use, Disp: 60 each, Rfl: 3   levonorgestrel (MIRENA) 20 MCG/24HR IUD, 1 each by Intrauterine route once., Disp: , Rfl:    ondansetron (ZOFRAN-ODT) 4 MG disintegrating tablet, Take 4 mg by mouth every 8 (eight) hours as needed., Disp: , Rfl:    pantoprazole (PROTONIX)  40 MG tablet, Take 40 mg by mouth 2 (two) times daily., Disp: , Rfl:    promethazine (PHENERGAN) 25 MG tablet, Take 1 tablet (25 mg total) by mouth every 6 (six) hours as needed for nausea or vomiting., Disp: 10 tablet, Rfl: 0   propranolol (INDERAL) 20 MG tablet, Take 1 tablet (20 mg total) by mouth 3 (three) times daily as needed (Palpitations)., Disp: 90 tablet, Rfl: 1   SUMAtriptan (IMITREX) 100 MG tablet, Take by mouth., Disp: , Rfl:    tiZANidine (ZANAFLEX) 4 MG tablet, Take by mouth., Disp: , Rfl:    topiramate (TOPAMAX) 100 MG tablet, , Disp: , Rfl:    traZODone (DESYREL) 100 MG tablet, Take 200 mg by mouth at bedtime as needed., Disp: , Rfl:    zonisamide (ZONEGRAN) 100 MG capsule, , Disp: , Rfl:    Objective:   Vitals:   06/10/22 0905  BP: 130/85  Pulse: (!) 103  SpO2: 97%    Physical Exam Vitals and nursing note reviewed.  Constitutional:      Appearance: Normal appearance.  HENT:     Head: Normocephalic and atraumatic.  Cardiovascular:     Rate and Rhythm: Normal rate.     Pulses: Normal pulses.  Pulmonary:     Effort: Pulmonary effort is normal.  Abdominal:     General: There is no distension.     Palpations: Abdomen is soft.     Tenderness: There is no abdominal tenderness.  Musculoskeletal:        General: No swelling or deformity.  Skin:    General: Skin is warm.     Capillary Refill: Capillary refill takes less than 2 seconds.     Coloration: Skin is not jaundiced.     Findings: No bruising.  Neurological:     Mental Status: She is alert and oriented to person, place, and time.  Psychiatric:        Mood and Affect: Mood normal.        Behavior: Behavior normal.     Assessment & Plan:  Numbness and tingling of right arm - Plan: Ambulatory referral to Physical Therapy  Symptomatic mammary hypertrophy - Plan: Ambulatory referral to Physical Therapy  Chronic bilateral thoracic back pain - Plan: Ambulatory referral to Physical Therapy  Neck pain -  Plan: Ambulatory referral to Physical Therapy  The procedure the patient selected and that was best for the patient was discussed. The risk were discussed and include but not limited to the following:  Breast asymmetry, fluid accumulation, firmness of the breast, inability to breast feed, loss of nipple or areola, skin loss, change in skin and nipple sensation, fat necrosis of the breast tissue, bleeding, infection and healing delay.  There  are risks of anesthesia and injury to nerves or blood vessels.  Allergic reaction to tape, suture and skin glue are possible.  There will be swelling.  Any of these can lead to the need for revisional surgery.  A breast reduction has potential to interfere with diagnostic procedures in the future.  This procedure is best done when the breast is fully developed.  Changes in the breast will continue to occur over time: pregnancy, weight gain or weigh loss.    Total time: 40 minutes. This includes time spent with the patient during the visit as well as time spent before and after the visit reviewing the chart, documenting the encounter, ordering pertinent studies and literature for the patient.   Physical therapy: Ordered Mammogram: Not indicated Healthy Weight and Wellness: She is currently in a weight loss program at [facility] on [address].  The patient will do the physical therapy and then come back to see [place].  She will need to continue on her weight loss journey.  She will also need to come off any immunosuppressive medications 1 month prior to the surgery and stay off of them for 1 month after the surgery in order to prevent the possibility of late complications and they incisions opening up.  The patient had her sister with her and they were in agreement and we will see her back about June.  Pictures were obtained of the patient and placed in the chart with the patient's or guardian's permission.   Tom Bean, DO

## 2022-06-17 ENCOUNTER — Ambulatory Visit: Payer: 59 | Admitting: Physical Therapy

## 2022-06-17 NOTE — Therapy (Incomplete)
OUTPATIENT PHYSICAL THERAPY CERVICAL EVALUATION   Patient Name: Cindy Dodson MRN: [id number] DOB:May 05, 1991, 32 y.o., female Today's Date: 06/17/2022  END OF SESSION:   Past Medical History:  Diagnosis Date   Anemia    microcytic hypochromic anemia   GERD (gastroesophageal reflux disease)    Hx of varicella    Hydronephrosis    Migraine    Past Surgical History:  Procedure Laterality Date   NO PAST SURGERIES     Patient Active Problem List   Diagnosis Date Noted   Symptomatic mammary hypertrophy 06/10/2022   Back pain 06/10/2022   Neck pain 06/10/2022   Coughing 06/28/2020   Other allergic rhinitis 06/28/2020   Heartburn 06/28/2020   Adverse reaction to food, subsequent encounter 06/28/2020   Family history of high cholesterol 01/17/2014   Numbness and tingling of right arm 11/03/2013   Arm pain, right 11/03/2013   Headache 06/29/2013   Skin lesion 06/29/2013   Family history of diabetes mellitus 06/29/2013   Family history of lung cancer 06/29/2013   Postpartum care following vaginal delivery  (8/27) 01/05/2013    PCP: Clyde Lundborg PA-C  REFERRING PROVIDER: Dillingham,Claire DO  REFERRING DIAG: R20.2 numbness/tingling right arm; M54.6, G89.29 chronic bil thoracic back pain; M54.2 neck pain  THERAPY DIAG:  Neck pain; back pain Rationale for Evaluation and Treatment: Rehabilitation  ONSET DATE: ***  SUBJECTIVE:                                                                                                                                                                                                         SUBJECTIVE STATEMENT: ***  PERTINENT HISTORY:  ***  PAIN:  PAIN:  Are you having pain? Yes NPRS scale: ***/10 Pain location: *** Aggravating factors: *** Relieving factors: ***   PRECAUTIONS: None  WEIGHT BEARING RESTRICTIONS: No  FALLS:  Has patient fallen in last 6 months? No  LIVING ENVIRONMENT: Lives with: lives with their  family Lives in: House/apartment Stairs: {opstairs:27293} Has following equipment at home: {Assistive devices:23999}  OCCUPATION: ***  PLOF: {PLOF:24004}  PATIENT GOALS: ***  NEXT MD VISIT: ***  OBJECTIVE:   DIAGNOSTIC FINDINGS:  ***  PATIENT SURVEYS:  FOTO ***  COGNITION: Overall cognitive status: Within functional limits for tasks assessed  SENSATION: {sensation:27233}  POSTURE: {posture:25561}  PALPATION: ***   CERVICAL ROM:   Active ROM A/PROM (deg) eval  Flexion   Extension   Right lateral flexion   Left lateral flexion   Right rotation   Left rotation    (Blank rows = not tested)  UPPER  EXTREMITY ROM:  LUMBAR ROM:   {AROM/PROM:27142}  A/PROM  eval  Flexion   Extension   Right lateral flexion   Left lateral flexion   Right rotation   Left rotation    (Blank rows = not tested)  UPPER EXTREMITY MMT:   FUNCTIONAL TESTS:  {Functional tests:24029}  TODAY'S TREATMENT:                                                                                                                              DATE: 2/6   PATIENT EDUCATION:  Education details: Educated patient on anatomy and physiology of current symptoms, prognosis, plan of care as well as initial self care strategies to promote recovery  Person educated: {Person educated:25204} Education method: {Education Method:25205} Education comprehension: {Education Comprehension:25206}  HOME EXERCISE PROGRAM: ***  ASSESSMENT:  CLINICAL IMPRESSION: Patient is a *** y.o. *** who was seen today for physical therapy evaluation and treatment for ***.   OBJECTIVE IMPAIRMENTS: {opptimpairments:25111}.   ACTIVITY LIMITATIONS: {activitylimitations:27494}  PARTICIPATION LIMITATIONS: {participationrestrictions:25113}  PERSONAL FACTORS: {Personal factors:25162} are also affecting patient's functional outcome.   REHAB POTENTIAL: {rehabpotential:25112}  CLINICAL DECISION MAKING: {clinical decision  making:25114}  EVALUATION COMPLEXITY: {Evaluation complexity:25115}   GOALS: Goals reviewed with patient? Yes  SHORT TERM GOALS: Target date: ***  *** Baseline: *** Goal status: {GOALSTATUS:25110}  2.  *** Baseline: *** Goal status: {GOALSTATUS:25110}  3.  *** Baseline: *** Goal status: {GOALSTATUS:25110}  4.  *** Baseline: *** Goal status: {GOALSTATUS:25110}  5.  *** Baseline: *** Goal status: {GOALSTATUS:25110}  6.  *** Baseline: *** Goal status: {GOALSTATUS:25110}  LONG TERM GOALS: Target date: 08/12/2022    *** Baseline: *** Goal status: {GOALSTATUS:25110}  2.  *** Baseline: *** Goal status: {GOALSTATUS:25110}  3.  *** Baseline: *** Goal status: {GOALSTATUS:25110}  4.  *** Baseline: *** Goal status: {GOALSTATUS:25110}  5.  *** Baseline: *** Goal status: {GOALSTATUS:25110}  6.  *** Baseline: *** Goal status: {GOALSTATUS:25110}   PLAN:  PT FREQUENCY: {rehab frequency:25116}  PT DURATION: {rehab duration:25117}  PLANNED INTERVENTIONS: {rehab planned interventions:25118::"Therapeutic exercises","Therapeutic activity","Neuromuscular re-education","Balance training","Gait training","Patient/Family education","Self Care","Joint mobilization"}  PLAN FOR NEXT SESSION: ***

## 2022-07-16 NOTE — Therapy (Signed)
OUTPATIENT PHYSICAL THERAPY CERVICAL/THORACOLUMBAR EVALUATION   Patient Name: Cindy Dodson MRN: [id number] DOB:1991/04/16, 32 y.o., female Today's Date: 07/17/2022  END OF SESSION:  PT End of Session - 07/17/22 0935     Visit Number 1    Date for PT Re-Evaluation 08/28/22    Authorization Type [employer] [id number]    PT Start Time [id number]    PT Stop Time [id number]    PT Time Calculation (min) 38 min    Activity Tolerance Patient tolerated treatment well    Behavior During Therapy WFL for tasks assessed/performed             Past Medical History:  Diagnosis Date   Anemia    microcytic hypochromic anemia   GERD (gastroesophageal reflux disease)    Hx of varicella    Hydronephrosis    Migraine    Past Surgical History:  Procedure Laterality Date   NO PAST SURGERIES     Patient Active Problem List   Diagnosis Date Noted   Symptomatic mammary hypertrophy 06/10/2022   Back pain 06/10/2022   Neck pain 06/10/2022   Coughing 06/28/2020   Other allergic rhinitis 06/28/2020   Heartburn 06/28/2020   Adverse reaction to food, subsequent encounter 06/28/2020   Family history of high cholesterol 01/17/2014   Numbness and tingling of right arm 11/03/2013   Arm pain, right 11/03/2013   Headache 06/29/2013   Skin lesion 06/29/2013   Family history of diabetes mellitus 06/29/2013   Family history of lung cancer 06/29/2013   Postpartum care following vaginal delivery  (8/27) 01/05/2013    PCP: Heywood Bene, PA-C   REFERRING PROVIDER: Wallace Going, DO   REFERRING DIAG:  R20.0,R20.2 (ICD-10-CM) - Numbness and tingling of right arm  N62 (ICD-10-CM) - Symptomatic mammary hypertrophy  M54.6,G89.29 (ICD-10-CM) - Chronic bilateral thoracic back pain  M54.2 (ICD-10-CM) - Neck pain    THERAPY DIAG:  Other low back pain  Cervicalgia  Pain in thoracic spine  Chronic right shoulder pain  Cramp and spasm  Rationale for Evaluation and Treatment:  Rehabilitation  ONSET DATE: years  SUBJECTIVE:                                                                                                                                                                                                         SUBJECTIVE STATEMENT: Always been heavy chested and has had back pain since high school. She has neck, upper back and lower back pain. Lower back is the worst, but depends on the day. Indents in shoulders due to weight  of breasts and this causes pain R> L shoulder.   PERTINENT HISTORY:  RA, Migraines from intracranial pressure, L sprained ankle  PAIN:  Are you having pain? Yes: NPRS scale: 4-5 up to 9/10 Pain location: lower Pain description: ache Aggravating factors: normal ADLS Relieving factors: moving, sitting forward  Neck and shoulder pain is similar.  PRECAUTIONS: None  WEIGHT BEARING RESTRICTIONS: No  FALLS:  Has patient fallen in last 6 months? Yes. 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 Increased tissue tension in right lumbar/QL, R paraspinals C/T/L,bil UT R>L Spinal Mobility: Decreased PA R Lumbar L3-L5/S1 painful, bil mid thoracic also painful  MUSCLE LENGTH:  Tight bil piriformis R>L, Bil HS L>R  CERVICAL ROM: Full  UPPER EXTREMITY ROM: Full  LOWER EXTREMITY ROM:  Full  LUMBAR ROM: FULL - pain with flex, end range ext  UPPER EXTREMITY MMT: grossly 5/5 in BUE except:  MMT Right eval Left eval  Shoulder internal rotation    Shoulder external rotation    Middle trapezius 5 5  Lower trapezius 3+pain 4+   (Blank rows = not tested)   LOWER EXTREMITY MMT: grossly 5/5 in BlE except EXT R 4+/5   TODAY'S TREATMENT:                                                                                                                              DATE:    07/17/22 See pt ed  PATIENT EDUCATION:  Education details: PT eval findings, anticipated POC, need for further assessment of FOTO, initial HEP, role of DN, and use of foam roller and discussed TENS trial  Person educated: Patient Education method: Explanation, Demonstration, and Handouts Education comprehension: verbalized understanding and returned demonstration  HOME EXERCISE PROGRAM: Access Code: [email or web address] URL: [email or web address] Date: 07/17/2022 Prepared by: Almyra Free  Exercises - Sidelying Thoracic Rotation with Open Book  - 1 x daily - 7 x weekly - 2 sets - 5 reps - Standing Quadratus Lumborum Stretch with Doorway  - 2 x daily - 7 x weekly - 1 sets - 2 reps - 30-60 sec hold - Seated Piriformis Stretch with Trunk Bend  - 2 x daily - 7 x weekly - 1 sets - 3 reps - 30-60 sec  hold - Supine Hamstring Stretch with Strap  - 2 x daily - 7 x weekly - 2  sets - 3 reps - 30 sec hold - Thoracic Extension Mobilization on Foam Roll  - 2 x daily - 7 x weekly - 2 sets - 5 reps  Patient Education - Trigger Point Dry Needling  ASSESSMENT:  CLINICAL IMPRESSION: Cindy Dodson  is a 32 y.o. female who was seen today for physical therapy evaluation and treatment for neck, upper back and shoulder pain. Patient has had heavy breasts since high school and subsequent pain in her low back, mid back, neck and shoulders since that time. She complains mainly of LBP today, but reports every day is  different and sometimes it's her neck or shoulders that are worse. She demonstrates good posture, but has significant tissue tension from her neck to her low back R>L. Her pain and flexibility deficits limit her from performing ADLS without pain. 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 Pain in her right shoulder also affects her sleep.  She will benefit from skilled PT to address these deficits.FOTO will be assessed next visit as time was limited with multi-area diagnosis.  OBJECTIVE IMPAIRMENTS: decreased activity tolerance, decreased strength, hypomobility, increased muscle spasms, impaired flexibility, and pain.   ACTIVITY LIMITATIONS: carrying, lifting, bending, sitting, standing, and sleeping  PARTICIPATION LIMITATIONS:  ADLs are all painful  PERSONAL FACTORS: Time since onset of injury/illness/exacerbation and 3+ comorbidities: RA, Migraines from intracranial pressure, L sprained ankle  are also affecting patient's functional outcome.   REHAB POTENTIAL: Fair plan to address deficits above, but limited due to mammary hypertrophy  CLINICAL DECISION MAKING: Stable/uncomplicated  EVALUATION COMPLEXITY: Moderate   GOALS: Goals reviewed with patient? Yes  SHORT TERM GOALS: Target date: 08/07/2022 (Remove Blue Hyperlink)  Patient will be independent with initial HEP.  Baseline:  Goal status: INITIAL  2.  FOTO Completed at 2nd visit and goal updated  Baseline:  Goal status: INITIAL   LONG TERM GOALS: Target date: 08/28/2022    Patient will be independent with advanced/ongoing HEP to improve outcomes and carryover.  Baseline:  Goal status: INITIAL  2.  Patient will report 75% improvement in low back, mid back, neck and shoulder pain to improve QOL.  Baseline:  Goal status: INITIAL  3.  Patient to demonstrate ability to achieve and maintain good spinal alignment/posturing and body mechanics needed for daily activities. Baseline:  Goal status: INITIAL  4.  Patient will  demonstrate full pain free lumbar ROM to perform ADLs.   Baseline:  Goal status: INITIAL  5.  Patient will demonstrate improved upper and lower extremity strength to 5/5. Baseline:  Goal status: INITIAL  6.  Patient will report predicted improvement on lumbar FOTO to demonstrate improved functional ability.  Baseline: TBD Goal status: INITIAL      PLAN:  PT FREQUENCY: 2x/week  PT DURATION: 6 weeks  PLANNED INTERVENTIONS: Therapeutic exercises, Therapeutic activity, Neuromuscular re-education, Patient/Family education, Self Care, Joint mobilization, Dry Needling, Electrical stimulation, Spinal mobilization, Cryotherapy, Taping, Traction, Ultrasound, Ionotophoresis '4mg'$ /ml Dexamethasone, and Manual therapy  PLAN FOR NEXT SESSION: Complete FOTO, DN/MT to R gluteals, lumbar, bil UT/neck, trial of TENS, scapular and postural strengthening, thoracic spine mobility, spinal stab and core strength, body mechanic/ADL ed.   Kathlean Cinco, PT 07/17/2022, 10:30 PM

## 2022-07-17 ENCOUNTER — Other Ambulatory Visit: Payer: Self-pay

## 2022-07-17 ENCOUNTER — Encounter: Payer: Self-pay | Admitting: Physical Therapy

## 2022-07-17 ENCOUNTER — Ambulatory Visit: Payer: 59 | Attending: Plastic Surgery | Admitting: Physical Therapy

## 2022-07-17 DIAGNOSIS — M546 Pain in thoracic spine: Secondary | ICD-10-CM

## 2022-07-17 DIAGNOSIS — R252 Cramp and spasm: Secondary | ICD-10-CM

## 2022-07-17 DIAGNOSIS — M542 Cervicalgia: Secondary | ICD-10-CM | POA: Diagnosis not present

## 2022-07-17 DIAGNOSIS — N62 Hypertrophy of breast: Secondary | ICD-10-CM | POA: Diagnosis not present

## 2022-07-17 DIAGNOSIS — R200 Anesthesia of skin: Secondary | ICD-10-CM | POA: Diagnosis present

## 2022-07-17 DIAGNOSIS — G8929 Other chronic pain: Secondary | ICD-10-CM | POA: Diagnosis not present

## 2022-07-17 DIAGNOSIS — M25511 Pain in right shoulder: Secondary | ICD-10-CM | POA: Insufficient documentation

## 2022-07-17 DIAGNOSIS — R202 Paresthesia of skin: Secondary | ICD-10-CM | POA: Diagnosis not present

## 2022-07-17 DIAGNOSIS — M5459 Other low back pain: Secondary | ICD-10-CM

## 2022-08-12 ENCOUNTER — Ambulatory Visit: Payer: 59 | Admitting: Physical Therapy

## 2022-08-14 ENCOUNTER — Encounter: Payer: 59 | Admitting: Physical Therapy

## 2022-08-18 NOTE — Therapy (Signed)
OUTPATIENT PHYSICAL THERAPY TREATMENT   Patient Name: Cindy Dodson MRN: [id number] DOB:1990-11-12, 32 y.o., female Today's Date: 08/19/2022  END OF SESSION:  PT End of Session - 08/19/22 1148     Visit Number 2    Date for PT Re-Evaluation 08/28/22    Authorization Type [employer] [id number]    PT Start Time 1148    PT Stop Time 1229    PT Time Calculation (min) 41 min    Activity Tolerance Patient tolerated treatment well    Behavior During Therapy WFL for tasks assessed/performed              Past Medical History:  Diagnosis Date   Anemia    microcytic hypochromic anemia   GERD (gastroesophageal reflux disease)    Hx of varicella    Hydronephrosis    Migraine    Past Surgical History:  Procedure Laterality Date   NO PAST SURGERIES     Patient Active Problem List   Diagnosis Date Noted   Symptomatic mammary hypertrophy 06/10/2022   Back pain 06/10/2022   Neck pain 06/10/2022   Coughing 06/28/2020   Other allergic rhinitis 06/28/2020   Heartburn 06/28/2020   Adverse reaction to food, subsequent encounter 06/28/2020   Family history of high cholesterol 01/17/2014   Numbness and tingling of right arm 11/03/2013   Arm pain, right 11/03/2013   Headache 06/29/2013   Skin lesion 06/29/2013   Family history of diabetes mellitus 06/29/2013   Family history of lung cancer 06/29/2013   Postpartum care following vaginal delivery  (8/27) 01/05/2013    PCP: Roger Kill, PA-C   REFERRING PROVIDER: Peggye Form, DO   REFERRING DIAG:  R20.0,R20.2 (ICD-10-CM) - Numbness and tingling of right arm  N62 (ICD-10-CM) - Symptomatic mammary hypertrophy  M54.6,G89.29 (ICD-10-CM) - Chronic bilateral thoracic back pain  M54.2 (ICD-10-CM) - Neck pain    THERAPY DIAG:  Other low back pain  Pain in thoracic spine  Cervicalgia  Chronic right shoulder pain  Cramp and spasm  Rationale for Evaluation and Treatment: Rehabilitation  ONSET DATE:  years  SUBJECTIVE:                                                                                                                                                                                                         SUBJECTIVE STATEMENT: The exercises have helped tremendously, especially the foam roller. Not feeling much with the doorway stretch though.  No pain today. Discomfort in upper back and low back.  PERTINENT HISTORY:  RA, Migraines from intracranial pressure,  L sprained ankle  PAIN:  Are you having pain? Yes: NPRS scale: 0/10 Pain location: lower Pain description: ache Aggravating factors: normal ADLS Relieving factors: moving, sitting forward  Neck and shoulder pain is similar.  PRECAUTIONS: None  WEIGHT BEARING RESTRICTIONS: No  FALLS:  Has patient fallen in last 6 months? Yes. 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 Increased tissue tension in right lumbar/QL, R paraspinals C/T/L,bil UT R>L Spinal Mobility: Decreased PA R Lumbar L3-L5/S1 painful, bil mid thoracic also painful  MUSCLE LENGTH:  Tight bil piriformis R>L, Bil HS L>R  CERVICAL ROM: Full  UPPER EXTREMITY ROM: Full  LOWER EXTREMITY ROM: Full  LUMBAR ROM: FULL - pain with flex, end range ext  UPPER EXTREMITY MMT: grossly 5/5 in BUE except:  MMT  Right eval Left eval  Shoulder internal rotation    Shoulder external rotation    Middle trapezius 5 5  Lower trapezius 3+pain 4+   (Blank rows = not tested)   LOWER EXTREMITY MMT: grossly 5/5 in BlE except EXT R 4+/5   TODAY'S TREATMENT:                                                                                                                              DATE:    08/19/22 UBE L2 x 6 min 64fwd/3bwd Quadriped legs x 5, then arms and legs x 5 ea Hooklying TA sequential march x 5 each side 90/90 alt toe taps x 10 ea  Manual Therapy: to decrease muscle spasm and pain and improve mobility Skilled palpation and monitoring of soft tissues during DN Trigger Point Dry-Needling  Treatment instructions: Expect mild to moderate muscle soreness. S/S of pneumothorax if dry needled over a lung field, and to seek immediate medical attention should they occur. Patient verbalized understanding of these instructions and education. Patient Consent Given: Yes Education handout provided: Previously provided Muscles treated: bil UT and lower cervical/upper thoracic multifidi Electrical stimulation performed: No Parameters: N/A Treatment response/outcome: Twitch Response Elicited and Palpable Increase in Muscle Length    07/17/22 See pt ed  PATIENT EDUCATION:  Education details: PT eval findings, anticipated POC, need for further assessment of FOTO, initial HEP, role of DN, and use of foam roller and discussed TENS trial  Person educated: Patient Education method: Explanation, Demonstration, and Handouts Education comprehension: verbalized understanding and returned  demonstration  HOME EXERCISE PROGRAM: Access Code: [email or web address] URL: [email or web address] Date: 08/19/2022 Prepared by: Raynelle FanningJulie  Exercises - Sidelying Thoracic Rotation with Open Book  - 1 x daily - 7 x weekly - 2 sets - 5 reps - Standing Quadratus Lumborum Stretch with Doorway  - 2 x daily - 7 x weekly - 1 sets - 2  reps - 30-60 sec hold - Seated Piriformis Stretch with Trunk Bend  - 2 x daily - 7 x weekly - 1 sets - 3 reps - 30-60 sec  hold - Supine Hamstring Stretch with Strap  - 2 x daily - 7 x weekly - 2 sets - 3 reps - 30 sec hold - Thoracic Extension Mobilization on Foam Roll  - 2 x daily - 7 x weekly - 2 sets - 5 reps - Bird Dog with Knee Taps  - 1 x daily - 7 x weekly - 3 sets - 10 reps - Hooklying Sequential Leg March and Lower  - 1 x daily - 7 x weekly - 3 sets - 10 reps  Patient Education - Trigger Point Dry Needling  ASSESSMENT:  CLINICAL IMPRESSION: Ramon DredgeJacqueline M Eisen presents today after 3 week absence due to vacation and illness. She reports significant improvement in overall pain from doing HEP. She continues to experience upper and low back pain, but it is less intense with her daily exercises. HEP progressed today for spinal stab and ab strengthening. Initial trial of DN/MT went well, especially in UT. Adela LankJacqueline continues to demonstrate potential for improvement and would benefit from continued skilled therapy to address impairments.   OBJECTIVE IMPAIRMENTS: decreased activity tolerance, decreased strength, hypomobility, increased muscle spasms, impaired flexibility, and pain.   ACTIVITY LIMITATIONS: carrying, lifting, bending, sitting, standing, and sleeping  PARTICIPATION LIMITATIONS:  ADLs are all painful  PERSONAL FACTORS: Time since onset of injury/illness/exacerbation and 3+ comorbidities: RA, Migraines from intracranial pressure, L sprained ankle  are also affecting patient's functional outcome.   REHAB POTENTIAL: Fair plan to address deficits above, but limited due to mammary hypertrophy  CLINICAL DECISION MAKING: Stable/uncomplicated  EVALUATION COMPLEXITY: Moderate   GOALS: Goals reviewed with patient? Yes  SHORT TERM GOALS: Target date: 08/07/2022   Patient will be independent with initial HEP.  Baseline:  Goal status: MET  2.  FOTO Completed at 2nd visit and  goal updated  Baseline:  Goal status: Deferred due to time between visit one and two   LONG TERM GOALS: Target date: 08/28/2022    Patient will be independent with advanced/ongoing HEP to improve outcomes and carryover.  Baseline:  Goal status: INITIAL  2.  Patient will report 75% improvement in low back, mid back, neck and shoulder pain to improve QOL.  Baseline:  Goal status: INITIAL  3.  Patient to demonstrate ability to achieve and maintain good spinal alignment/posturing and body mechanics needed for daily activities. Baseline:  Goal status: INITIAL  4.  Patient will demonstrate full pain free lumbar ROM to perform ADLs.   Baseline:  Goal status: INITIAL  5.  Patient will demonstrate improved upper and lower extremity strength to 5/5. Baseline:  Goal status: INITIAL  6.  Patient will report predicted improvement on lumbar FOTO to demonstrate improved functional ability.  Baseline: Deferred Goal status: REVISED      PLAN:  PT FREQUENCY: 2x/week  PT DURATION: 6 weeks  PLANNED INTERVENTIONS: Therapeutic exercises, Therapeutic activity, Neuromuscular re-education, Patient/Family education, Self Care, Joint mobilization, Dry Needling, Electrical stimulation, Spinal mobilization, Cryotherapy, Taping, Traction, Ultrasound, Ionotophoresis  4mg /ml Dexamethasone, and Manual therapy  PLAN FOR NEXT SESSION: Assess DN and continue prn with DN/MT to R gluteals, lumbar, bil UT/neck, trial of TENS, scapular and postural strengthening, thoracic spine mobility, spinal stab and core strength, body mechanic/ADL ed.   Gracy Ehly, PT 08/19/2022, 1:47 PM

## 2022-08-19 ENCOUNTER — Ambulatory Visit: Payer: 59 | Attending: Plastic Surgery | Admitting: Physical Therapy

## 2022-08-19 ENCOUNTER — Encounter: Payer: Self-pay | Admitting: Physical Therapy

## 2022-08-19 ENCOUNTER — Ambulatory Visit: Payer: 59 | Admitting: Plastic Surgery

## 2022-08-19 DIAGNOSIS — R252 Cramp and spasm: Secondary | ICD-10-CM | POA: Insufficient documentation

## 2022-08-19 DIAGNOSIS — G8929 Other chronic pain: Secondary | ICD-10-CM | POA: Insufficient documentation

## 2022-08-19 DIAGNOSIS — M542 Cervicalgia: Secondary | ICD-10-CM | POA: Insufficient documentation

## 2022-08-19 DIAGNOSIS — M546 Pain in thoracic spine: Secondary | ICD-10-CM | POA: Insufficient documentation

## 2022-08-19 DIAGNOSIS — M5459 Other low back pain: Secondary | ICD-10-CM | POA: Insufficient documentation

## 2022-08-19 DIAGNOSIS — M25511 Pain in right shoulder: Secondary | ICD-10-CM | POA: Insufficient documentation

## 2022-08-21 ENCOUNTER — Ambulatory Visit: Payer: 59 | Admitting: Physical Therapy

## 2022-08-27 NOTE — Therapy (Signed)
OUTPATIENT PHYSICAL THERAPY TREATMENT   Patient Name: Cindy Dodson MRN: [id number] DOB:02-07-1991, 32 y.o., female Today's Date: 08/19/2022  END OF SESSION:  PT End of Session - 08/19/22 1148     Visit Number 2    Date for PT Re-Evaluation 08/28/22    Authorization Type [employer] [id number]    PT Start Time 1148    PT Stop Time 1229    PT Time Calculation (min) 41 min    Activity Tolerance Patient tolerated treatment well    Behavior During Therapy WFL for tasks assessed/performed              Past Medical History:  Diagnosis Date   Anemia    microcytic hypochromic anemia   GERD (gastroesophageal reflux disease)    Hx of varicella    Hydronephrosis    Migraine    Past Surgical History:  Procedure Laterality Date   NO PAST SURGERIES     Patient Active Problem List   Diagnosis Date Noted   Symptomatic mammary hypertrophy 06/10/2022   Back pain 06/10/2022   Neck pain 06/10/2022   Coughing 06/28/2020   Other allergic rhinitis 06/28/2020   Heartburn 06/28/2020   Adverse reaction to food, subsequent encounter 06/28/2020   Family history of high cholesterol 01/17/2014   Numbness and tingling of right arm 11/03/2013   Arm pain, right 11/03/2013   Headache 06/29/2013   Skin lesion 06/29/2013   Family history of diabetes mellitus 06/29/2013   Family history of lung cancer 06/29/2013   Postpartum care following vaginal delivery  (8/27) 01/05/2013    PCP: Roger Kill, PA-C   REFERRING PROVIDER: Peggye Form, DO   REFERRING DIAG:  R20.0,R20.2 (ICD-10-CM) - Numbness and tingling of right arm  N62 (ICD-10-CM) - Symptomatic mammary hypertrophy  M54.6,G89.29 (ICD-10-CM) - Chronic bilateral thoracic back pain  M54.2 (ICD-10-CM) - Neck pain    THERAPY DIAG:  Other low back pain  Pain in thoracic spine  Cervicalgia  Chronic right shoulder pain  Cramp and spasm  Rationale for Evaluation and Treatment: Rehabilitation  ONSET DATE:  years  SUBJECTIVE:                                                                                                                                                                                                         SUBJECTIVE STATEMENT: ***  PERTINENT HISTORY:  RA, Migraines from intracranial pressure, L sprained ankle  PAIN:  Are you having pain? Yes: NPRS scale: 0/10 Pain location: lower Pain description: ache Aggravating factors: normal ADLS Relieving factors: moving,  sitting forward  Neck and shoulder pain is similar.  PRECAUTIONS: None  WEIGHT BEARING RESTRICTIONS: No  FALLS:  Has patient fallen in last 6 months? Yes. 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 Increased tissue tension in right lumbar/QL, R paraspinals C/T/L,bil UT R>L Spinal Mobility: Decreased PA R Lumbar L3-L5/S1 painful, bil mid thoracic also painful  MUSCLE LENGTH:  Tight bil piriformis R>L, Bil HS L>R  CERVICAL ROM: Full  UPPER EXTREMITY ROM: Full  LOWER EXTREMITY ROM: Full  LUMBAR ROM: FULL - pain with flex, end range ext  UPPER EXTREMITY MMT: grossly 5/5 in BUE except:  MMT Right eval Left eval  Shoulder internal rotation    Shoulder external rotation    Middle trapezius 5 5  Lower trapezius 3+pain 4+   (Blank rows = not  tested)   LOWER EXTREMITY MMT: grossly 5/5 in BlE except EXT R 4+/5   TODAY'S TREATMENT:                                                                                                                              DATE:    08/28/22 UBE L2 x 6 min 4fwd/3bwd Quadriped legs x 5, then arms and legs x 5 ea Hooklying TA sequential march x 5 each side 90/90 alt toe taps x 10 ea  Manual Therapy: to decrease muscle spasm and pain and improve mobility Skilled palpation and monitoring of soft tissues during DN Trigger Point Dry-Needling  Treatment instructions: Expect mild to moderate muscle soreness. S/S of pneumothorax if dry needled over a lung field, and to seek immediate medical attention should they occur. Patient verbalized understanding of these instructions and education. Patient Consent Given: Yes Education handout provided: Previously provided Muscles treated: bil UT and lower cervical/upper thoracic multifidi Electrical stimulation performed: No Parameters: N/A Treatment response/outcome: Twitch Response Elicited and Palpable Increase in Muscle Length  08/19/22 UBE L2 x 6 min 53fwd/3bwd Quadriped legs x 5, then arms and legs x 5 ea Hooklying TA sequential march x 5 each side 90/90 alt toe taps x 10 ea  Manual Therapy: to decrease muscle spasm and pain and improve mobility Skilled palpation and monitoring of soft tissues during DN Trigger Point Dry-Needling  Treatment instructions: Expect mild to moderate muscle soreness. S/S of pneumothorax if dry needled over a lung field, and to seek immediate  medical attention should they occur. Patient verbalized understanding of these instructions and education. Patient Consent Given: Yes Education handout provided: Previously provided Muscles treated: bil UT and lower cervical/upper thoracic multifidi Electrical stimulation performed: No Parameters: N/A Treatment response/outcome: Twitch Response Elicited and Palpable Increase in Muscle  Length    07/17/22 See pt ed  PATIENT EDUCATION: *** Education details: PT eval findings, anticipated POC, need for further assessment of FOTO, initial HEP, role of DN, and use of foam roller and discussed TENS trial  Person educated: Patient Education method: Explanation, Demonstration, and Handouts Education comprehension: verbalized understanding and returned demonstration  HOME EXERCISE PROGRAM: Access Code: [email or web address] URL: [email or web address] Date: 08/19/2022 Prepared by: Raynelle Fanning  Exercises - Sidelying Thoracic Rotation with Open Book  - 1 x daily - 7 x weekly - 2 sets - 5 reps - Standing Quadratus Lumborum Stretch with Doorway  - 2 x daily - 7 x weekly - 1 sets - 2 reps - 30-60 sec hold - Seated Piriformis Stretch with Trunk Bend  - 2 x daily - 7 x weekly - 1 sets - 3 reps - 30-60 sec  hold - Supine Hamstring Stretch with Strap  - 2 x daily - 7 x weekly - 2 sets - 3 reps - 30 sec hold - Thoracic Extension Mobilization on Foam Roll  - 2 x daily - 7 x weekly - 2 sets - 5 reps - Bird Dog with Knee Taps  - 1 x daily - 7 x weekly - 3 sets - 10 reps - Hooklying Sequential Leg March and Lower  - 1 x daily - 7 x weekly - 3 sets - 10 reps  Patient Education - Trigger Point Dry Needling  ASSESSMENT:  CLINICAL IMPRESSION: ***  OBJECTIVE IMPAIRMENTS: decreased activity tolerance, decreased strength, hypomobility, increased muscle spasms, impaired flexibility, and pain.   ACTIVITY LIMITATIONS: carrying, lifting, bending, sitting, standing, and sleeping  PARTICIPATION LIMITATIONS:  ADLs are all painful  PERSONAL FACTORS: Time since onset of injury/illness/exacerbation and 3+ comorbidities: RA, Migraines from intracranial pressure, L sprained ankle  are also affecting patient's functional outcome.   REHAB POTENTIAL: Fair plan to address deficits above, but limited due to mammary hypertrophy  CLINICAL DECISION MAKING: Stable/uncomplicated  EVALUATION COMPLEXITY:  Moderate   GOALS: Goals reviewed with patient? Yes  SHORT TERM GOALS: Target date: 08/07/2022   Patient will be independent with initial HEP.  Baseline:  Goal status: MET  2.  FOTO Completed at 2nd visit and goal updated  Baseline:  Goal status: Deferred due to time between visit one and two   LONG TERM GOALS: Target date: 08/28/2022    Patient will be independent with advanced/ongoing HEP to improve outcomes and carryover.  Baseline:  Goal status: INITIAL  2.  Patient will report 75% improvement in low back, mid back, neck and shoulder pain to improve QOL.  Baseline:  Goal status: INITIAL  3.  Patient to demonstrate ability to achieve and maintain good spinal alignment/posturing and body mechanics needed for daily activities. Baseline:  Goal status: INITIAL  4.  Patient will demonstrate full pain free lumbar ROM to perform ADLs.   Baseline:  Goal status: INITIAL  5.  Patient will demonstrate improved upper and lower extremity strength to 5/5. Baseline:  Goal status: INITIAL  6.  Patient will report predicted improvement on lumbar FOTO to demonstrate improved functional ability.  Baseline: Deferred Goal status: REVISED      PLAN:  PT FREQUENCY: 2x/week  PT DURATION: 6 weeks  PLANNED INTERVENTIONS: Therapeutic exercises, Therapeutic activity, Neuromuscular re-education, Patient/Family education, Self Care, Joint mobilization, Dry Needling, Electrical stimulation, Spinal mobilization, Cryotherapy, Taping, Traction, Ultrasound, Ionotophoresis /ml Dexamethasone, and Manual therapy  PLAN FOR NEXT SESSION: Assess DN and continue prn with DN/MT to R gluteals, lumbar, bil UT/neck, trial of TENS, scapular and postural strengthening, thoracic spine mobility, spinal stab and core strength, body mechanic/ADL ed.   Efstathios Sawin, PT 08/19/2022, 1:47 PM

## 2022-08-28 ENCOUNTER — Encounter: Payer: Self-pay | Admitting: Physical Therapy

## 2022-08-28 ENCOUNTER — Ambulatory Visit: Payer: 59 | Admitting: Physical Therapy

## 2022-08-28 DIAGNOSIS — M546 Pain in thoracic spine: Secondary | ICD-10-CM

## 2022-08-28 DIAGNOSIS — G8929 Other chronic pain: Secondary | ICD-10-CM

## 2022-08-28 DIAGNOSIS — R252 Cramp and spasm: Secondary | ICD-10-CM

## 2022-08-28 DIAGNOSIS — M542 Cervicalgia: Secondary | ICD-10-CM

## 2022-08-28 DIAGNOSIS — M5459 Other low back pain: Secondary | ICD-10-CM

## 2022-08-29 ENCOUNTER — Ambulatory Visit: Payer: 59 | Admitting: Surgical

## 2022-09-01 NOTE — Therapy (Signed)
OUTPATIENT PHYSICAL THERAPY TREATMENT    Patient Name: Cindy Dodson MRN: [id number] DOB:03-13-1991, 32 y.o., female Today's Date: 09/02/2022  END OF SESSION:  PT End of Session - 09/02/22 0927     Visit Number 4    Date for PT Re-Evaluation 10/10/22    Authorization Type [employer] [id number]    PT Start Time 0930    PT Stop Time 1015    PT Time Calculation (min) 45 min    Activity Tolerance Patient tolerated treatment well    Behavior During Therapy WFL for tasks assessed/performed              Past Medical History:  Diagnosis Date   Anemia    microcytic hypochromic anemia   GERD (gastroesophageal reflux disease)    Hx of varicella    Hydronephrosis    Migraine    Past Surgical History:  Procedure Laterality Date   NO PAST SURGERIES     Patient Active Problem List   Diagnosis Date Noted   Symptomatic mammary hypertrophy 06/10/2022   Back pain 06/10/2022   Neck pain 06/10/2022   Coughing 06/28/2020   Other allergic rhinitis 06/28/2020   Heartburn 06/28/2020   Adverse reaction to food, subsequent encounter 06/28/2020   Family history of high cholesterol 01/17/2014   Numbness and tingling of right arm 11/03/2013   Arm pain, right 11/03/2013   Headache 06/29/2013   Skin lesion 06/29/2013   Family history of diabetes mellitus 06/29/2013   Family history of lung cancer 06/29/2013   Postpartum care following vaginal delivery  (8/27) 01/05/2013    PCP: Roger Kill, PA-C   REFERRING PROVIDER: Peggye Form, DO   REFERRING DIAG:  R20.0,R20.2 (ICD-10-CM) - Numbness and tingling of right arm  N62 (ICD-10-CM) - Symptomatic mammary hypertrophy  M54.6,G89.29 (ICD-10-CM) - Chronic bilateral thoracic back pain  M54.2 (ICD-10-CM) - Neck pain    THERAPY DIAG:  Other low back pain  Pain in thoracic spine  Cervicalgia  Chronic right shoulder pain  Cramp and spasm  Rationale for Evaluation and Treatment: Rehabilitation  ONSET DATE:  years  SUBJECTIVE:                                                                                                                                                                                                         SUBJECTIVE STATEMENT: My low back is sore today. I've changed when I take my Meloxicam and now I'm forgetting to take it so that's probably why my bone pain is back.  PERTINENT HISTORY:  RA, Migraines from  intracranial pressure, L sprained ankle  PAIN:  Are you having pain? Yes: NPRS scale: 4/10 Pain location: Right neck/UT Pain description: ache Aggravating factors: normal ADLS Relieving factors: moving, sitting forward  Neck and shoulder pain is similar.  PRECAUTIONS: Other: INTRACRANIAL PRESSURE  WEIGHT BEARING RESTRICTIONS: No  FALLS:  Has patient fallen in last 6 months? Yes. 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 Increased tissue tension in right lumbar/QL, R paraspinals C/T/L,bil UT R>L Spinal Mobility: Decreased PA R Lumbar L3-L5/S1 painful, bil mid thoracic also painful  MUSCLE LENGTH:  Tight bil piriformis R>L, Bil HS L>R  CERVICAL ROM: Full  UPPER EXTREMITY ROM: Full  LOWER EXTREMITY ROM: Full  LUMBAR ROM: FULL - pain with flex, end range ext  UPPER EXTREMITY MMT: grossly 5/5 in BUE  except:  MMT Right eval Left eval  Shoulder internal rotation    Shoulder external rotation    Middle trapezius 5 5  Lower trapezius 3+pain 4+   (Blank rows = not tested)   LOWER EXTREMITY MMT: grossly 5/5 in BlE except EXT R 4+/5   TODAY'S TREATMENT:                                                                                                                              DATE:   09/02/22 Nustep L5 x 6 min Pallof press GTB x 10 B, trunk rotation GTB x 10 B Rows 15# 2x 10 Lat pull 40# 2x 10 Standing low trap set at wall x 10  Manual Therapy: to decrease muscle spasm and pain and improve mobility Skilled palpation and monitoring of soft tissues during DN. STM to bil gluteals. Trigger Point Dry-Needling  Treatment instructions: Expect mild to moderate muscle soreness. S/S of pneumothorax if dry needled over a lung field, and to seek immediate medical attention should they occur. Patient verbalized understanding of these instructions and education. Patient Consent Given: Yes Education handout provided: Previously provided Muscles treated: bil gluteals Electrical stimulation performed: No Parameters: N/A Treatment response/outcome: Twitch Response Elicited and Palpable Increase in Muscle Length   08/28/22 Nustep L5 x 6 min Rows 15# x 10 Lat pull 40# x 10 Shouder ext BTB x 10 T's over ball x 10 X to Y's standing with theraball behind back x 10 Standing low trap at wall reviewed for HEP  Manual Therapy: to  decrease muscle spasm and pain and improve mobility Skilled palpation and monitoring of soft tissues during DN. STM to bil UT/IS. Trigger Point Dry-Needling  Treatment instructions: Expect mild to moderate muscle soreness. S/S of pneumothorax if dry needled over a lung field, and to seek immediate medical attention should they occur. Patient verbalized understanding of these instructions and education. Patient Consent Given: Yes Education handout provided: Previously  provided Muscles treated: bil UT and R LS and infraspinatus Electrical stimulation performed: No Parameters: N/A Treatment response/outcome: Twitch Response Elicited and Palpable Increase in Muscle Length  08/19/22 UBE L2 x 6 min 43fwd/3bwd Quadriped legs x 5, then arms and legs x 5 ea Hooklying TA sequential march x 5 each side 90/90 alt toe taps x 10 ea  Manual Therapy: to decrease muscle spasm and pain and improve mobility Skilled palpation and monitoring of soft tissues during DN Trigger Point Dry-Needling  Treatment instructions: Expect mild to moderate muscle soreness. S/S of pneumothorax if dry needled over a lung field, and to seek immediate medical attention should they occur. Patient verbalized understanding of these instructions and education. Patient Consent Given: Yes Education handout provided: Previously provided Muscles treated: bil UT and lower cervical/upper thoracic multifidi Electrical stimulation performed: No Parameters: N/A Treatment response/outcome: Twitch Response Elicited and Palpable Increase in Muscle Length    07/17/22 See pt ed  PATIENT EDUCATION:  Education details: HEP update  Person educated: Patient Education method: Explanation, Demonstration, and Handouts Education comprehension: verbalized understanding and returned demonstration  HOME EXERCISE PROGRAM: Access Code: [email or web address] URL: [email or web address] Date: 09/02/2022 Prepared by: Raynelle Fanning  Exercises - Sidelying Thoracic Rotation with Open Book  - 1 x daily - 7 x weekly - 2 sets - 5 reps - Standing Quadratus Lumborum Stretch with Doorway  - 2 x daily - 7 x weekly - 1 sets - 2 reps - 30-60 sec hold - Seated Piriformis Stretch with Trunk Bend  - 2 x daily - 7 x weekly - 1 sets - 3 reps - 30-60 sec  hold - Supine Hamstring Stretch with Strap  - 2 x daily - 7 x weekly - 2 sets - 3 reps - 30 sec hold - Thoracic Extension Mobilization on Foam Roll  - 2 x daily - 7 x weekly - 2 sets -  5 reps - Bird Dog with Knee Taps  - 1 x daily - 7 x weekly - 3 sets - 10 reps - Hooklying Sequential Leg March and Lower  - 1 x daily - 7 x weekly - 3 sets - 10 reps - Supine 90/90 Alternating Toe Touch  - 1 x daily - 7 x weekly - 3 sets - 10 reps - Standing Shoulder Row with Anchored Resistance  - 1 x daily - 4 x weekly - 1-3 sets - 10 reps - Shoulder extension with resistance - Neutral  - 1 x daily - 4 x weekly - 3 sets - 10 reps - Seated Lat Pull Down with Resistance - Elbows Bent  - 1 x daily - 4 x weekly - 3 sets - 10 reps - Prone Middle Trapezius Strengthening on Swiss Ball  - 1 x daily - 4 x weekly - 3 sets - 10 reps - Low Trap Setting at Wall  - 1 x daily - 4 x weekly - 3 sets - 10 reps - Squatting Anti-Rotation Press  - 1 x daily - 4 x weekly - 3 sets - 10 reps - Standing Trunk Rotation with Resistance  -  1 x daily - 4 x weekly - 3 sets - 10 reps  ASSESSMENT:  CLINICAL IMPRESSION: Cindy Dodson reports more low back pain today and that her neck and upper back have really been feeling better. We worked on standing core strengthening due to her intracranial pressure and she tolerated this much better than quadriped activities. Initial trial of DN/MT to gluteals today with excellent twitch responses L> R. She is progressing well with pain control and really "loves all the exercises". Samaa continues to demonstrate potential for improvement and would benefit from continued skilled therapy to address impairments.      OBJECTIVE IMPAIRMENTS: decreased activity tolerance, decreased strength, hypomobility, increased muscle spasms, impaired flexibility, and pain.   ACTIVITY LIMITATIONS: carrying, lifting, bending, sitting, standing, and sleeping  PARTICIPATION LIMITATIONS:  ADLs are all painful  PERSONAL FACTORS: Time since onset of injury/illness/exacerbation and 3+ comorbidities: RA, Migraines from intracranial pressure, L sprained ankle  are also affecting patient's functional outcome.    REHAB POTENTIAL: Fair plan to address deficits above, but limited due to mammary hypertrophy  CLINICAL DECISION MAKING: Stable/uncomplicated  EVALUATION COMPLEXITY: Moderate   GOALS: Goals reviewed with patient? Yes  SHORT TERM GOALS: Target date: 08/07/2022   Patient will be independent with initial HEP.  Baseline:  Goal status: MET  2.  FOTO Completed at 2nd visit and goal updated  Baseline:  Goal status: Deferred due to time between visit one and two   LONG TERM GOALS: Target date: 08/28/2022  extended to 10/10/22  Patient will be independent with advanced/ongoing HEP to improve outcomes and carryover.  Baseline:  Goal status: IN PROGRESS  2.  Patient will report 75% improvement in low back, mid back, neck and shoulder pain to improve QOL.  Baseline:  Goal status: IN PROGRESS 5% improvement 08/28/22  3.  Patient to demonstrate ability to achieve and maintain good spinal alignment/posturing and body mechanics needed for daily activities. Baseline:  Goal status: IN PROGRESS  4.  Patient will demonstrate full pain free lumbar ROM to perform ADLs.   Baseline:  Goal status: IN PROGRESS 08/28/22 pain with extension  5.  Patient will demonstrate improved upper and lower extremity strength to 5/5. Baseline:  Goal status: INITIAL  6.  Patient will report predicted improvement on lumbar FOTO to demonstrate improved functional ability.  Baseline: Deferred Goal status: REVISED      PLAN:  PT FREQUENCY: 2x/week  PT DURATION: 6 weeks  PLANNED INTERVENTIONS: Therapeutic exercises, Therapeutic activity, Neuromuscular re-education, Patient/Family education, Self Care, Joint mobilization, Dry Needling, Electrical stimulation, Spinal mobilization, Cryotherapy, Taping, Traction, Ultrasound, Ionotophoresis 4mg /ml Dexamethasone, and Manual therapy  PLAN FOR NEXT SESSION: Assess DN and continue prn with DN/MT, scapular and postural strengthening, thoracic spine mobility, spinal  stab and core strength, body mechanic/ADL ed.   Kendrix Orman, PT 09/02/2022, 1:03 PM

## 2022-09-02 ENCOUNTER — Ambulatory Visit: Payer: 59 | Admitting: Physical Therapy

## 2022-09-02 ENCOUNTER — Encounter: Payer: Self-pay | Admitting: Physical Therapy

## 2022-09-02 DIAGNOSIS — M546 Pain in thoracic spine: Secondary | ICD-10-CM

## 2022-09-02 DIAGNOSIS — G8929 Other chronic pain: Secondary | ICD-10-CM

## 2022-09-02 DIAGNOSIS — M5459 Other low back pain: Secondary | ICD-10-CM | POA: Diagnosis not present

## 2022-09-02 DIAGNOSIS — M542 Cervicalgia: Secondary | ICD-10-CM

## 2022-09-02 DIAGNOSIS — R252 Cramp and spasm: Secondary | ICD-10-CM

## 2022-09-11 ENCOUNTER — Ambulatory Visit: Payer: 59

## 2022-09-16 ENCOUNTER — Ambulatory Visit: Payer: 59 | Attending: Plastic Surgery

## 2022-09-16 ENCOUNTER — Institutional Professional Consult (permissible substitution): Payer: Self-pay | Admitting: Plastic Surgery

## 2022-09-16 DIAGNOSIS — R252 Cramp and spasm: Secondary | ICD-10-CM | POA: Diagnosis present

## 2022-09-16 DIAGNOSIS — M542 Cervicalgia: Secondary | ICD-10-CM | POA: Diagnosis present

## 2022-09-16 DIAGNOSIS — R262 Difficulty in walking, not elsewhere classified: Secondary | ICD-10-CM | POA: Diagnosis present

## 2022-09-16 DIAGNOSIS — R293 Abnormal posture: Secondary | ICD-10-CM | POA: Insufficient documentation

## 2022-09-16 DIAGNOSIS — M6281 Muscle weakness (generalized): Secondary | ICD-10-CM | POA: Diagnosis present

## 2022-09-16 DIAGNOSIS — G8929 Other chronic pain: Secondary | ICD-10-CM | POA: Diagnosis present

## 2022-09-16 DIAGNOSIS — M5459 Other low back pain: Secondary | ICD-10-CM | POA: Diagnosis present

## 2022-09-16 DIAGNOSIS — M25511 Pain in right shoulder: Secondary | ICD-10-CM | POA: Diagnosis present

## 2022-09-16 DIAGNOSIS — M546 Pain in thoracic spine: Secondary | ICD-10-CM | POA: Insufficient documentation

## 2022-09-16 NOTE — Therapy (Signed)
OUTPATIENT PHYSICAL THERAPY TREATMENT    Patient Name: Cindy Dodson MRN: [id number] DOB:1991-03-11, 32 y.o., female Today's Date: 09/16/2022  END OF SESSION:  PT End of Session - 09/16/22 0933     Visit Number 5    Date for PT Re-Evaluation 10/10/22    Authorization Type [employer] [id number]    PT Start Time 0933    PT Stop Time 1015    PT Time Calculation (min) 42 min    Activity Tolerance Patient tolerated treatment well    Behavior During Therapy WFL for tasks assessed/performed              Past Medical History:  Diagnosis Date   Anemia    microcytic hypochromic anemia   GERD (gastroesophageal reflux disease)    Hx of varicella    Hydronephrosis    Migraine    Past Surgical History:  Procedure Laterality Date   NO PAST SURGERIES     Patient Active Problem List   Diagnosis Date Noted   Symptomatic mammary hypertrophy 06/10/2022   Back pain 06/10/2022   Neck pain 06/10/2022   Coughing 06/28/2020   Other allergic rhinitis 06/28/2020   Heartburn 06/28/2020   Adverse reaction to food, subsequent encounter 06/28/2020   Family history of high cholesterol 01/17/2014   Numbness and tingling of right arm 11/03/2013   Arm pain, right 11/03/2013   Headache 06/29/2013   Skin lesion 06/29/2013   Family history of diabetes mellitus 06/29/2013   Family history of lung cancer 06/29/2013   Postpartum care following vaginal delivery  (8/27) 01/05/2013    PCP: Roger Kill, PA-C   REFERRING PROVIDER: Peggye Form, DO   REFERRING DIAG:  R20.0,R20.2 (ICD-10-CM) - Numbness and tingling of right arm  N62 (ICD-10-CM) - Symptomatic mammary hypertrophy  M54.6,G89.29 (ICD-10-CM) - Chronic bilateral thoracic back pain  M54.2 (ICD-10-CM) - Neck pain    THERAPY DIAG:  Other low back pain  Pain in thoracic spine  Cervicalgia  Chronic right shoulder pain  Cramp and spasm  Rationale for Evaluation and Treatment: Rehabilitation  ONSET DATE:  years  SUBJECTIVE:                                                                                                                                                                                                         SUBJECTIVE STATEMENT: Patient reports more pain in the low back vs upper back and neck.  No significant increase and she feels she is responding well to current treatment and HEP.    PERTINENT HISTORY:  RA,  Migraines from intracranial pressure, L sprained ankle  PAIN:  Are you having pain? Yes: NPRS scale: 4/10 Pain location: Right neck/UT Pain description: ache Aggravating factors: normal ADLS Relieving factors: moving, sitting forward  Neck and shoulder pain is similar.  PRECAUTIONS: Other: INTRACRANIAL PRESSURE  WEIGHT BEARING RESTRICTIONS: No  FALLS:  Has patient fallen in last 6 months? Yes. 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 Increased tissue tension in right lumbar/QL, R paraspinals C/T/L,bil UT R>L Spinal Mobility: Decreased PA R Lumbar L3-L5/S1 painful, bil mid thoracic also painful  MUSCLE LENGTH:  Tight bil piriformis R>L, Bil HS L>R  CERVICAL ROM: Full  UPPER EXTREMITY ROM: Full  LOWER EXTREMITY ROM: Full  LUMBAR ROM: FULL - pain with flex, end range ext  UPPER EXTREMITY MMT: grossly 5/5  in BUE except:  MMT Right eval Left eval  Shoulder internal rotation    Shoulder external rotation    Middle trapezius 5 5  Lower trapezius 3+pain 4+   (Blank rows = not tested)   LOWER EXTREMITY MMT: grossly 5/5 in BlE except EXT R 4+/5   TODAY'S TREATMENT:                                                                                                                              DATE:  09/16/22 Nustep L5 x 6 min Standing hamstring stretch 3 x 30 sec Standing quad stretch 3 x 30 sec  Hook lying bridge x 20  Supine IT band stretching 2 x 30"  Manual Therapy: to decrease muscle spasm and pain and improve mobility Skilled palpation and monitoring of soft tissues during DN. STM to lumbar multifidi. Trigger Point Dry-Needling  Treatment instructions: Expect mild to moderate muscle soreness. S/S of pneumothorax if dry needled over a lung field, and to seek immediate medical attention should they occur. Patient verbalized understanding of these instructions and education. Patient Consent Given: Yes Education handout provided: Previously provided Muscles treated: lumbar multifidi Electrical stimulation performed: No Parameters: N/A Treatment response/outcome: Deep ache response Elicited and patient was able to come to stand and transfer from supine to sit with increased ease.   09/02/22: Nustep L5 x 6 min Pallof press GTB x 10 B, trunk rotation GTB x 10 B Rows 15# 2x 10 Lat pull 40# 2x 10 Standing low trap set at wall x 10  Manual Therapy: to decrease muscle spasm and pain and improve mobility Skilled palpation and monitoring of soft tissues during DN. STM to bil gluteals. Trigger Point Dry-Needling  Treatment instructions: Expect mild to moderate muscle soreness. S/S of pneumothorax if dry needled over a lung field, and to seek immediate medical attention should they occur. Patient verbalized understanding of these instructions and education. Patient Consent Given: Yes Education  handout provided: Previously provided Muscles treated: bil gluteals Electrical stimulation performed: No Parameters: N/A Treatment response/outcome: Twitch Response Elicited and Palpable Increase in Muscle Length   08/28/22: Nustep L5 x 6 min Rows 15# x 10 Lat pull 40# x 10 Shouder ext BTB x 10 T's over ball x 10 X to Y's standing with theraball behind back x 10 Standing low trap at wall reviewed for HEP  Manual Therapy: to decrease muscle spasm and pain and improve mobility Skilled palpation and monitoring of soft tissues during DN. STM to bil UT/IS. Trigger Point Dry-Needling  Treatment instructions: Expect mild to moderate muscle soreness. S/S of pneumothorax if dry needled over a lung field, and to seek immediate medical attention should they occur. Patient verbalized understanding of these instructions and education. Patient Consent Given: Yes Education handout provided: Previously provided Muscles treated: bil UT and R LS and infraspinatus Electrical stimulation performed: No Parameters: N/A Treatment response/outcome: Twitch Response Elicited and Palpable Increase in Muscle Length  08/19/22 UBE L2 x 6 min 39fwd/3bwd Quadriped legs x 5, then arms and legs x 5 ea Hooklying TA sequential march x 5 each side 90/90 alt toe taps x 10 ea  Manual Therapy: to decrease muscle spasm and pain and improve mobility Skilled palpation and monitoring of soft tissues during DN Trigger Point Dry-Needling  Treatment instructions: Expect mild to moderate muscle soreness. S/S of pneumothorax if dry needled over a lung field, and to seek immediate medical attention should they occur. Patient verbalized understanding of these instructions and education. Patient Consent Given: Yes Education handout provided: Previously provided Muscles treated: bil UT and lower cervical/upper thoracic multifidi Electrical stimulation performed: No Parameters: N/A Treatment response/outcome: Twitch Response  Elicited and Palpable Increase in Muscle Length    07/17/22 See pt ed  PATIENT EDUCATION:  Education details: HEP update  Person educated: Patient Education method: Explanation, Demonstration, and Handouts Education comprehension: verbalized understanding and returned demonstration  HOME EXERCISE PROGRAM: Access Code: [email or web address] URL: [email or web address] Date: 09/02/2022 Prepared by: Raynelle Fanning  Exercises - Sidelying Thoracic Rotation with Open Book  - 1 x daily - 7 x weekly - 2 sets - 5 reps - Standing Quadratus Lumborum Stretch with Doorway  - 2 x daily - 7 x weekly - 1 sets - 2 reps - 30-60 sec hold - Seated Piriformis Stretch with Trunk Bend  - 2 x daily - 7 x weekly - 1 sets - 3 reps - 30-60 sec  hold - Supine Hamstring Stretch with Strap  - 2 x daily - 7 x weekly - 2 sets - 3 reps - 30 sec hold - Thoracic Extension Mobilization on Foam Roll  - 2 x daily - 7 x weekly - 2 sets - 5 reps - Bird Dog with Knee Taps  - 1 x daily - 7 x weekly - 3 sets - 10 reps - Hooklying Sequential Leg March and Lower  - 1 x daily - 7 x weekly - 3 sets - 10 reps - Supine 90/90 Alternating Toe Touch  - 1 x daily - 7 x weekly - 3 sets -  10 reps - Standing Shoulder Row with Anchored Resistance  - 1 x daily - 4 x weekly - 1-3 sets - 10 reps - Shoulder extension with resistance - Neutral  - 1 x daily - 4 x weekly - 3 sets - 10 reps - Seated Lat Pull Down with Resistance - Elbows Bent  - 1 x daily - 4 x weekly - 3 sets - 10 reps - Prone Middle Trapezius Strengthening on Swiss Ball  - 1 x daily - 4 x weekly - 3 sets - 10 reps - Low Trap Setting at Wall  - 1 x daily - 4 x weekly - 3 sets - 10 reps - Squatting Anti-Rotation Press  - 1 x daily - 4 x weekly - 3 sets - 10 reps - Standing Trunk Rotation with Resistance  - 1 x daily - 4 x weekly - 3 sets - 10 reps  ASSESSMENT:  CLINICAL IMPRESSION: Cindy Dodson is progressing appropriately and has been compliant with her HEP.  She demonstrated more  pronounced hamstring and quad tightness in the left LE.  She responded well to dry needling.   Cindy Dodson continues to demonstrate potential for improvement and would benefit from continued skilled therapy to address impairments.    OBJECTIVE IMPAIRMENTS: decreased activity tolerance, decreased strength, hypomobility, increased muscle spasms, impaired flexibility, and pain.   ACTIVITY LIMITATIONS: carrying, lifting, bending, sitting, standing, and sleeping  PARTICIPATION LIMITATIONS:  ADLs are all painful  PERSONAL FACTORS: Time since onset of injury/illness/exacerbation and 3+ comorbidities: RA, Migraines from intracranial pressure, L sprained ankle  are also affecting patient's functional outcome.   REHAB POTENTIAL: Fair plan to address deficits above, but limited due to mammary hypertrophy  CLINICAL DECISION MAKING: Stable/uncomplicated  EVALUATION COMPLEXITY: Moderate   GOALS: Goals reviewed with patient? Yes  SHORT TERM GOALS: Target date: 08/07/2022   Patient will be independent with initial HEP.  Baseline:  Goal status: MET  2.  FOTO Completed at 2nd visit and goal updated  Baseline:  Goal status: Deferred due to time between visit one and two   LONG TERM GOALS: Target date: 08/28/2022  extended to 10/10/22  Patient will be independent with advanced/ongoing HEP to improve outcomes and carryover.  Baseline:  Goal status: IN PROGRESS  2.  Patient will report 75% improvement in low back, mid back, neck and shoulder pain to improve QOL.  Baseline:  Goal status: IN PROGRESS 5% improvement 08/28/22  3.  Patient to demonstrate ability to achieve and maintain good spinal alignment/posturing and body mechanics needed for daily activities. Baseline:  Goal status: IN PROGRESS  4.  Patient will demonstrate full pain free lumbar ROM to perform ADLs.   Baseline:  Goal status: IN PROGRESS 08/28/22 pain with extension  5.  Patient will demonstrate improved upper and lower  extremity strength to 5/5. Baseline:  Goal status: INITIAL  6.  Patient will report predicted improvement on lumbar FOTO to demonstrate improved functional ability.  Baseline: Deferred Goal status: REVISED      PLAN:  PT FREQUENCY: 2x/week  PT DURATION: 6 weeks  PLANNED INTERVENTIONS: Therapeutic exercises, Therapeutic activity, Neuromuscular re-education, Patient/Family education, Self Care, Joint mobilization, Dry Needling, Electrical stimulation, Spinal mobilization, Cryotherapy, Taping, Traction, Ultrasound, Ionotophoresis 4mg /ml Dexamethasone, and Manual therapy  PLAN FOR NEXT SESSION: Assess response to lumbar DN and continue prn with DN/MT, continue LE flexibility,  scapular and postural strengthening, thoracic spine mobility, spinal stab and core strength, body mechanic/ADL ed.   Victorino Dike B. Tyeesha Riker, PT 09/16/22 5:02 PM  [facility] [address] [place], [place] [address] Phone # [phone] Fax [phone]

## 2022-09-18 ENCOUNTER — Ambulatory Visit: Payer: 59

## 2022-09-18 DIAGNOSIS — M6281 Muscle weakness (generalized): Secondary | ICD-10-CM

## 2022-09-18 DIAGNOSIS — R262 Difficulty in walking, not elsewhere classified: Secondary | ICD-10-CM

## 2022-09-18 DIAGNOSIS — M5459 Other low back pain: Secondary | ICD-10-CM

## 2022-09-18 DIAGNOSIS — M546 Pain in thoracic spine: Secondary | ICD-10-CM

## 2022-09-18 DIAGNOSIS — M542 Cervicalgia: Secondary | ICD-10-CM

## 2022-09-18 DIAGNOSIS — R252 Cramp and spasm: Secondary | ICD-10-CM

## 2022-09-18 NOTE — Therapy (Signed)
OUTPATIENT PHYSICAL THERAPY TREATMENT    Patient Name: Cindy Dodson MRN: [id number] DOB:February 25, 1991, 32 y.o., female Today's Date: 09/18/2022  END OF SESSION:  PT End of Session - 09/18/22 0941     Visit Number 6    Date for PT Re-Evaluation 10/10/22    Authorization Type [employer] [id number]    PT Start Time 0930    PT Stop Time 1013    PT Time Calculation (min) 43 min    Activity Tolerance Patient tolerated treatment well    Behavior During Therapy WFL for tasks assessed/performed              Past Medical History:  Diagnosis Date   Anemia    microcytic hypochromic anemia   GERD (gastroesophageal reflux disease)    Hx of varicella    Hydronephrosis    Migraine    Past Surgical History:  Procedure Laterality Date   NO PAST SURGERIES     Patient Active Problem List   Diagnosis Date Noted   Symptomatic mammary hypertrophy 06/10/2022   Back pain 06/10/2022   Neck pain 06/10/2022   Coughing 06/28/2020   Other allergic rhinitis 06/28/2020   Heartburn 06/28/2020   Adverse reaction to food, subsequent encounter 06/28/2020   Family history of high cholesterol 01/17/2014   Numbness and tingling of right arm 11/03/2013   Arm pain, right 11/03/2013   Headache 06/29/2013   Skin lesion 06/29/2013   Family history of diabetes mellitus 06/29/2013   Family history of lung cancer 06/29/2013   Postpartum care following vaginal delivery  (8/27) 01/05/2013    PCP: Roger Kill, PA-C   REFERRING PROVIDER: Peggye Form, DO   REFERRING DIAG:  R20.0,R20.2 (ICD-10-CM) - Numbness and tingling of right arm  N62 (ICD-10-CM) - Symptomatic mammary hypertrophy  M54.6,G89.29 (ICD-10-CM) - Chronic bilateral thoracic back pain  M54.2 (ICD-10-CM) - Neck pain    THERAPY DIAG:  Other low back pain  Pain in thoracic spine  Cervicalgia  Cramp and spasm  Muscle weakness (generalized)  Difficulty in walking, not elsewhere classified  Rationale for Evaluation and  Treatment: Rehabilitation  ONSET DATE: years  SUBJECTIVE:                                                                                                                                                                                                         SUBJECTIVE STATEMENT: Patient reports the needling seems to have helped some but the needling to the gluteals seemed to have done more to relieve the symptoms.  She rates her pain at  3-4/10.    PERTINENT HISTORY:  RA, Migraines from intracranial pressure, L sprained ankle  PAIN:  Are you having pain? Yes: NPRS scale: 3-4/10 Pain location: Right neck/UT Pain description: ache Aggravating factors: normal ADLS Relieving factors: moving, sitting forward  Neck and shoulder pain is similar.  PRECAUTIONS: Other: INTRACRANIAL PRESSURE  WEIGHT BEARING RESTRICTIONS: No  FALLS:  Has patient fallen in last 6 months? Yes. 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 Increased tissue tension in right lumbar/QL, R paraspinals C/T/L,bil UT R>L Spinal Mobility: Decreased PA R Lumbar L3-L5/S1 painful, bil mid thoracic also painful  MUSCLE LENGTH:  Tight bil piriformis R>L, Bil HS L>R  CERVICAL ROM: Full  UPPER EXTREMITY ROM: Full  LOWER EXTREMITY ROM: Full  LUMBAR ROM: FULL - pain with  flex, end range ext  UPPER EXTREMITY MMT: grossly 5/5 in BUE except:  MMT Right eval Left eval  Shoulder internal rotation    Shoulder external rotation    Middle trapezius 5 5  Lower trapezius 3+pain 4+   (Blank rows = not tested)   LOWER EXTREMITY MMT: grossly 5/5 in BlE except EXT R 4+/5   TODAY'S TREATMENT:                                                                                                                              DATE:  09/18/22 Nustep L5 x 6 min Standing hamstring stretch 3 x 30 sec Standing quad stretch 3 x 30 sec  PPT x 20 PPT with 90/90 heel taps x 20 PPT with dying bug x 20 Hooklying trunk rotation x 20 Hooklying clam with yellow loop x 20  Hook lying bridge with yellow loop around knees x 20  Sidelying clam with yellow loop  x 10  DATE:  09/16/22 Nustep L5 x 6 min Standing hamstring stretch 3 x 30 sec Standing quad stretch 3 x 30 sec  Hook lying bridge x 20  Supine IT band stretching 2 x 30"  Manual Therapy: to decrease muscle spasm and pain and improve mobility Skilled palpation and monitoring of soft tissues during DN. STM to lumbar multifidi. Trigger Point Dry-Needling  Treatment instructions: Expect mild to moderate muscle soreness. S/S of pneumothorax if dry needled over a lung field, and to seek immediate medical attention should they occur. Patient verbalized understanding of these instructions and  education. Patient Consent Given: Yes Education handout provided: Previously provided Muscles treated: lumbar multifidi Electrical stimulation performed: No Parameters: N/A Treatment response/outcome: Deep ache response Elicited and patient was able to come to stand and transfer from supine to sit with increased ease.   09/02/22: Nustep L5 x 6 min Pallof press GTB x 10 B, trunk rotation GTB x 10 B Rows 15# 2x 10 Lat pull 40# 2x 10 Standing low trap set at wall x 10  Manual Therapy: to decrease muscle spasm and pain and improve  mobility Skilled palpation and monitoring of soft tissues during DN. STM to bil gluteals. Trigger Point Dry-Needling  Treatment instructions: Expect mild to moderate muscle soreness. S/S of pneumothorax if dry needled over a lung field, and to seek immediate medical attention should they occur. Patient verbalized understanding of these instructions and education. Patient Consent Given: Yes Education handout provided: Previously provided Muscles treated: bil gluteals Electrical stimulation performed: No Parameters: N/A Treatment response/outcome: Twitch Response Elicited and Palpable Increase in Muscle Length  PATIENT EDUCATION:  Education details: HEP update  Person educated: Patient Education method: Explanation, Demonstration, and Handouts Education comprehension: verbalized understanding and returned demonstration  HOME EXERCISE PROGRAM: Access Code: [email or web address] URL: [email or web address] Date: 09/02/2022 Prepared by: Raynelle Fanning  Exercises - Sidelying Thoracic Rotation with Open Book  - 1 x daily - 7 x weekly - 2 sets - 5 reps - Standing Quadratus Lumborum Stretch with Doorway  - 2 x daily - 7 x weekly - 1 sets - 2 reps - 30-60 sec hold - Seated Piriformis Stretch with Trunk Bend  - 2 x daily - 7 x weekly - 1 sets - 3 reps - 30-60 sec  hold - Supine Hamstring Stretch with Strap  - 2 x daily - 7 x weekly - 2 sets - 3 reps - 30 sec hold - Thoracic Extension Mobilization on Foam Roll  - 2 x daily - 7 x weekly - 2 sets - 5 reps - Bird Dog with Knee Taps  - 1 x daily - 7 x weekly - 3 sets - 10 reps - Hooklying Sequential Leg March and Lower  - 1 x daily - 7 x weekly - 3 sets - 10 reps - Supine 90/90 Alternating Toe Touch  - 1 x daily - 7 x weekly - 3 sets - 10 reps - Standing Shoulder Row with Anchored Resistance  - 1 x daily - 4 x weekly - 1-3 sets - 10 reps - Shoulder extension with resistance - Neutral  - 1 x daily - 4 x weekly - 3 sets - 10 reps - Seated Lat Pull Down with  Resistance - Elbows Bent  - 1 x daily - 4 x weekly - 3 sets - 10 reps - Prone Middle Trapezius Strengthening on Swiss Ball  - 1 x daily - 4 x weekly - 3 sets - 10 reps - Low Trap Setting at Wall  - 1 x daily - 4 x weekly - 3 sets - 10 reps - Squatting Anti-Rotation Press  - 1 x daily - 4 x weekly - 3 sets - 10 reps - Standing Trunk Rotation with Resistance  - 1 x daily - 4 x weekly - 3 sets - 10 reps  ASSESSMENT:  CLINICAL IMPRESSION: Caily had some relief with the lumbar multifidi DN but not as much as when we did the gluteals.  She was able to tolerate some core strengthening today but did struggle to maintain pelvic tilt.  She denied  any increase in pain throughout but she did have some minor cranial pressure issues with exertion in supine.  We elevated the head of the bed and she was ok to continue.  Lakeysa continues to demonstrate potential for improvement and would benefit from continued skilled therapy to address impairments.    OBJECTIVE IMPAIRMENTS: decreased activity tolerance, decreased strength, hypomobility, increased muscle spasms, impaired flexibility, and pain.   ACTIVITY LIMITATIONS: carrying, lifting, bending, sitting, standing, and sleeping  PARTICIPATION LIMITATIONS:  ADLs are all painful  PERSONAL FACTORS: Time since onset of injury/illness/exacerbation and 3+ comorbidities: RA, Migraines from intracranial pressure, L sprained ankle  are also affecting patient's functional outcome.   REHAB POTENTIAL: Fair plan to address deficits above, but limited due to mammary hypertrophy  CLINICAL DECISION MAKING: Stable/uncomplicated  EVALUATION COMPLEXITY: Moderate   GOALS: Goals reviewed with patient? Yes  SHORT TERM GOALS: Target date: 08/07/2022   Patient will be independent with initial HEP.  Baseline:  Goal status: MET  2.  FOTO Completed at 2nd visit and goal updated  Baseline:  Goal status: Deferred due to time between visit one and two   LONG TERM  GOALS: Target date: 08/28/2022  extended to 10/10/22  Patient will be independent with advanced/ongoing HEP to improve outcomes and carryover.  Baseline:  Goal status: IN PROGRESS  2.  Patient will report 75% improvement in low back, mid back, neck and shoulder pain to improve QOL.  Baseline:  Goal status: IN PROGRESS 5% improvement 08/28/22  3.  Patient to demonstrate ability to achieve and maintain good spinal alignment/posturing and body mechanics needed for daily activities. Baseline:  Goal status: IN PROGRESS  4.  Patient will demonstrate full pain free lumbar ROM to perform ADLs.   Baseline:  Goal status: IN PROGRESS 08/28/22 pain with extension  5.  Patient will demonstrate improved upper and lower extremity strength to 5/5. Baseline:  Goal status: INITIAL  6.  Patient will report predicted improvement on lumbar FOTO to demonstrate improved functional ability.  Baseline: Deferred Goal status: REVISED      PLAN:  PT FREQUENCY: 2x/week  PT DURATION: 6 weeks  PLANNED INTERVENTIONS: Therapeutic exercises, Therapeutic activity, Neuromuscular re-education, Patient/Family education, Self Care, Joint mobilization, Dry Needling, Electrical stimulation, Spinal mobilization, Cryotherapy, Taping, Traction, Ultrasound, Ionotophoresis 4mg /ml Dexamethasone, and Manual therapy  PLAN FOR NEXT SESSION:  DN/MT as needed, continue LE flexibility,  scapular and postural strengthening, thoracic spine mobility, spinal stab and core strength, body mechanic/ADL ed.   Victorino Dike B. Dustine Stickler, PT 09/18/22 10:17 AM [facility] [address], [address] [place], [place] [address] Phone # [phone] Fax [phone]

## 2022-09-23 ENCOUNTER — Ambulatory Visit: Payer: 59

## 2022-09-23 DIAGNOSIS — M546 Pain in thoracic spine: Secondary | ICD-10-CM

## 2022-09-23 DIAGNOSIS — R262 Difficulty in walking, not elsewhere classified: Secondary | ICD-10-CM

## 2022-09-23 DIAGNOSIS — G8929 Other chronic pain: Secondary | ICD-10-CM

## 2022-09-23 DIAGNOSIS — M6281 Muscle weakness (generalized): Secondary | ICD-10-CM

## 2022-09-23 DIAGNOSIS — R252 Cramp and spasm: Secondary | ICD-10-CM

## 2022-09-23 DIAGNOSIS — M5459 Other low back pain: Secondary | ICD-10-CM | POA: Diagnosis not present

## 2022-09-23 DIAGNOSIS — R293 Abnormal posture: Secondary | ICD-10-CM

## 2022-09-23 DIAGNOSIS — M542 Cervicalgia: Secondary | ICD-10-CM

## 2022-09-23 NOTE — Therapy (Signed)
OUTPATIENT PHYSICAL THERAPY TREATMENT    Patient Name: Cindy Dodson MRN: [id number] DOB:[birth year], 32 y.o., female Today's Date: 09/23/2022  END OF SESSION:  PT End of Session - 09/23/22 0939     Visit Number 7    Date for PT Re-Evaluation 10/10/22    Authorization Type [employer] [id number]    PT Start Time 0933    PT Stop Time 1020    PT Time Calculation (min) 47 min    Activity Tolerance Patient tolerated treatment well    Behavior During Therapy [facility] for tasks assessed/performed              Past Medical History:  Diagnosis Date   Anemia    microcytic hypochromic anemia   GERD (gastroesophageal reflux disease)    Hx of varicella    Hydronephrosis    Migraine    Past Surgical History:  Procedure Laterality Date   NO PAST SURGERIES     Patient Active Problem List   Diagnosis Date Noted   Symptomatic mammary hypertrophy 06/10/2022   Back pain 06/10/2022   Neck pain 06/10/2022   Coughing 06/28/2020   Other allergic rhinitis 06/28/2020   Heartburn 06/28/2020   Adverse reaction to food, subsequent encounter 06/28/2020   Family history of high cholesterol 01/17/2014   Numbness and tingling of right arm 11/03/2013   Arm pain, right 11/03/2013   Headache 06/29/2013   Skin lesion 06/29/2013   Family history of diabetes mellitus 06/29/2013   Family history of lung cancer 06/29/2013   Postpartum care following vaginal delivery  (8/27) 01/05/2013    PCP: Roger Kill, PA-C   REFERRING PROVIDER: Peggye Form, DO   REFERRING DIAG:  R20.0,R20.2 (ICD-10-CM) - Numbness and tingling of right arm  N62 (ICD-10-CM) - Symptomatic mammary hypertrophy  M54.6,G89.29 (ICD-10-CM) - Chronic bilateral thoracic back pain  M54.2 (ICD-10-CM) - Neck pain    THERAPY DIAG:  Other low back pain  Pain in thoracic spine  Cervicalgia  Cramp and spasm  Muscle weakness (generalized)  Difficulty in walking, not elsewhere classified  Chronic right shoulder  pain  Abnormal posture  Rationale for Evaluation and Treatment: Rehabilitation  ONSET DATE: years  SUBJECTIVE:                                                                                                                                                                                                         SUBJECTIVE STATEMENT: Patient reports her low back is feeling pretty good.  My neck seems to be this issue more now.  I am  also having a lot of joint pain today from my RA.  "My knees are hurting today"  PERTINENT HISTORY:  RA, Migraines from intracranial pressure, L sprained ankle  PAIN:  09/23/22: Are you having pain? Yes: NPRS scale: 5/10 Pain location: Right neck/UT Pain description: ache Aggravating factors: normal ADLS Relieving factors: moving, sitting forward  Neck and shoulder pain is similar.  PRECAUTIONS: Other: INTRACRANIAL PRESSURE  WEIGHT BEARING RESTRICTIONS: No  FALLS:  Has patient fallen in last 6 months? Yes. 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 Increased tissue tension in right lumbar/QL, R paraspinals C/T/L,bil UT R>L Spinal Mobility: Decreased PA R Lumbar L3-L5/S1 painful, bil mid thoracic also painful  MUSCLE LENGTH:  Tight bil piriformis R>L, Bil HS L>R  CERVICAL ROM: Full  UPPER  EXTREMITY ROM: Full  LOWER EXTREMITY ROM: Full  LUMBAR ROM: FULL - pain with flex, end range ext  UPPER EXTREMITY MMT: grossly 5/5 in BUE except:  MMT Right eval Left eval  Shoulder internal rotation    Shoulder external rotation    Middle trapezius 5 5  Lower trapezius 3+pain 4+   (Blank rows = not tested)   LOWER EXTREMITY MMT: grossly 5/5 in BlE except EXT R 4+/5   TODAY'S TREATMENT:                                                                                                                              DATE:  09/23/22 UBE x 5 min (2.5/2.5) Tband shoulder extension, rows, bilateral ER and horizontal abduction x 20 each with red tband (patient had to do sets of 5 on horizontal abduction due to fatigue and pain Shoulder rolls x 20 Trigger Point Dry-Needling  Treatment instructions: Expect mild to moderate muscle soreness. S/S of pneumothorax if dry needled over a lung field, and to seek immediate medical attention should they occur. Patient verbalized understanding of these instructions and education.  Patient Consent Given: Yes Education handout provided: Yes Muscles treated: bilateral upper traps, parascapular areas/rhomboids Electrical stimulation performed: No Parameters: N/A Treatment response/outcome: Skilled palpation used to identify taut bands and trigger points.  Once identified, dry needling techniques used to treat these areas.  Twitch response ellicited along with palpable elongation of muscle.  Following treatment, patient reported mild soreness but improved cervical    DATE:  09/18/22 Nustep L5 x 6  min Standing hamstring stretch 3 x 30 sec Standing quad stretch 3 x 30 sec  PPT x 20 PPT with 90/90 heel taps x 20 PPT with dying bug x 20 Hooklying trunk rotation x 20 Hooklying clam with yellow loop x 20  Hook lying bridge with yellow loop around knees x 20  Sidelying clam with yellow loop  x 10  DATE:  09/16/22 Nustep L5 x 6 min Standing hamstring stretch  3 x 30 sec Standing quad stretch 3 x 30 sec  Hook lying bridge x 20  Supine IT band stretching 2 x 30"  Manual Therapy: to decrease muscle spasm and pain and improve mobility Skilled palpation and monitoring of soft tissues during DN. STM to lumbar multifidi. Trigger Point Dry-Needling  Treatment instructions: Expect mild to moderate muscle soreness. S/S of pneumothorax if dry needled over a lung field, and to seek immediate medical attention should they occur. Patient verbalized understanding of these instructions and education. Patient Consent Given: Yes Education handout provided: Previously provided Muscles treated: lumbar multifidi Electrical stimulation performed: No Parameters: N/A Treatment response/outcome: Deep ache response Elicited and patient was able to come to stand and transfer from supine to sit with increased ease.   09/02/22: Nustep L5 x 6 min Pallof press GTB x 10 B, trunk rotation GTB x 10 B Rows 15# 2x 10 Lat pull 40# 2x 10 Standing low trap set at wall x 10  Manual Therapy: to decrease muscle spasm and pain and improve mobility Skilled palpation and monitoring of soft tissues during DN. STM to bil gluteals. Trigger Point Dry-Needling  Treatment instructions: Expect mild to moderate muscle soreness. S/S of pneumothorax if dry needled over a lung field, and to seek immediate medical attention should they occur. Patient verbalized understanding of these instructions and education. Patient Consent Given: Yes Education handout provided: Previously provided Muscles treated: bil gluteals Electrical stimulation performed: No Parameters: N/A Treatment response/outcome: Twitch Response Elicited and Palpable Increase in Muscle Length  PATIENT EDUCATION:  Education details: HEP update  Person educated: Patient Education method: Explanation, Demonstration, and Handouts Education comprehension: verbalized understanding and returned demonstration  HOME EXERCISE  PROGRAM: Access Code: [email or web address] URL: [email or web address] Date: 09/02/2022 Prepared by: Raynelle Fanning  Exercises - Sidelying Thoracic Rotation with Open Book  - 1 x daily - 7 x weekly - 2 sets - 5 reps - Standing Quadratus Lumborum Stretch with Doorway  - 2 x daily - 7 x weekly - 1 sets - 2 reps - 30-60 sec hold - Seated Piriformis Stretch with Trunk Bend  - 2 x daily - 7 x weekly - 1 sets - 3 reps - 30-60 sec  hold - Supine Hamstring Stretch with Strap  - 2 x daily - 7 x weekly - 2 sets - 3 reps - 30 sec hold - Thoracic Extension Mobilization on Foam Roll  - 2 x daily - 7 x weekly - 2 sets - 5 reps - Bird Dog with Knee Taps  - 1 x daily - 7 x weekly - 3 sets - 10 reps - Hooklying Sequential Leg March and Lower  - 1 x daily - 7 x weekly - 3 sets - 10 reps - Supine 90/90 Alternating Toe Touch  - 1 x daily - 7 x weekly - 3 sets - 10 reps - Standing Shoulder Row with Anchored Resistance  - 1 x daily - 4 x weekly - 1-3 sets - 10 reps - Shoulder extension with resistance - Neutral  - 1 x  daily - 4 x weekly - 3 sets - 10 reps - Seated Lat Pull Down with Resistance - Elbows Bent  - 1 x daily - 4 x weekly - 3 sets - 10 reps - Prone Middle Trapezius Strengthening on Swiss Ball  - 1 x daily - 4 x weekly - 3 sets - 10 reps - Low Trap Setting at Wall  - 1 x daily - 4 x weekly - 3 sets - 10 reps - Squatting Anti-Rotation Press  - 1 x daily - 4 x weekly - 3 sets - 10 reps - Standing Trunk Rotation with Resistance  - 1 x daily - 4 x weekly - 3 sets - 10 reps  ASSESSMENT:  CLINICAL IMPRESSION: Eldora is progressing appropriately.  She is beginning to understand the importance of movement with regard to her chronic pain. She had several twitch responses during dry needling today.    Austyn continues to demonstrate potential for improvement and would benefit from continued skilled therapy to address impairments.    OBJECTIVE IMPAIRMENTS: decreased activity tolerance, decreased strength,  hypomobility, increased muscle spasms, impaired flexibility, and pain.   ACTIVITY LIMITATIONS: carrying, lifting, bending, sitting, standing, and sleeping  PARTICIPATION LIMITATIONS:  ADLs are all painful  PERSONAL FACTORS: Time since onset of injury/illness/exacerbation and 3+ comorbidities: RA, Migraines from intracranial pressure, L sprained ankle  are also affecting patient's functional outcome.   REHAB POTENTIAL: Fair plan to address deficits above, but limited due to mammary hypertrophy  CLINICAL DECISION MAKING: Stable/uncomplicated  EVALUATION COMPLEXITY: Moderate   GOALS: Goals reviewed with patient? Yes  SHORT TERM GOALS: Target date: 08/07/2022   Patient will be independent with initial HEP.  Baseline:  Goal status: MET  2.  FOTO Completed at 2nd visit and goal updated  Baseline:  Goal status: Deferred due to time between visit one and two   LONG TERM GOALS: Target date: 08/28/2022  extended to 10/10/22  Patient will be independent with advanced/ongoing HEP to improve outcomes and carryover.  Baseline:  Goal status: IN PROGRESS  2.  Patient will report 75% improvement in low back, mid back, neck and shoulder pain to improve QOL.  Baseline:  Goal status: IN PROGRESS 5% improvement 08/28/22  3.  Patient to demonstrate ability to achieve and maintain good spinal alignment/posturing and body mechanics needed for daily activities. Baseline:  Goal status: IN PROGRESS  4.  Patient will demonstrate full pain free lumbar ROM to perform ADLs.   Baseline:  Goal status: IN PROGRESS 08/28/22 pain with extension  5.  Patient will demonstrate improved upper and lower extremity strength to 5/5. Baseline:  Goal status: INITIAL  6.  Patient will report predicted improvement on lumbar FOTO to demonstrate improved functional ability.  Baseline: Deferred Goal status: REVISED      PLAN:  PT FREQUENCY: 2x/week  PT DURATION: 6 weeks  PLANNED INTERVENTIONS: Therapeutic  exercises, Therapeutic activity, Neuromuscular re-education, Patient/Family education, Self Care, Joint mobilization, Dry Needling, Electrical stimulation, Spinal mobilization, Cryotherapy, Taping, Traction, Ultrasound, Ionotophoresis 4mg /ml Dexamethasone, and Manual therapy  PLAN FOR NEXT SESSION:  Scapular and postural strengthening, thoracic spine mobility, spinal stab and core strength, body mechanic/ADL ed DN as needed for muscle spasm and trigger points.    Victorino Dike B. Tremaine Fuhriman, PT 09/23/22 8:16 PM  [facility] [address] [place], [place] [address] Phone # [phone] Fax [phone]

## 2022-09-25 ENCOUNTER — Ambulatory Visit: Payer: 59

## 2022-10-02 ENCOUNTER — Ambulatory Visit: Payer: 59

## 2022-10-02 DIAGNOSIS — M546 Pain in thoracic spine: Secondary | ICD-10-CM

## 2022-10-02 DIAGNOSIS — R252 Cramp and spasm: Secondary | ICD-10-CM

## 2022-10-02 DIAGNOSIS — M5459 Other low back pain: Secondary | ICD-10-CM | POA: Diagnosis not present

## 2022-10-02 DIAGNOSIS — R262 Difficulty in walking, not elsewhere classified: Secondary | ICD-10-CM

## 2022-10-02 DIAGNOSIS — M6281 Muscle weakness (generalized): Secondary | ICD-10-CM

## 2022-10-02 DIAGNOSIS — M542 Cervicalgia: Secondary | ICD-10-CM

## 2022-10-02 DIAGNOSIS — G8929 Other chronic pain: Secondary | ICD-10-CM

## 2022-10-02 DIAGNOSIS — R293 Abnormal posture: Secondary | ICD-10-CM

## 2022-10-02 NOTE — Therapy (Addendum)
 OUTPATIENT PHYSICAL THERAPY TREATMENT  PHYSICAL THERAPY DISCHARGE SUMMARY  Visits from Start of Care: 8  Current functional level related to goals / functional outcomes: See below   Remaining deficits: See below   Education / Equipment: See below   Patient agrees to discharge. Patient goals were partially met. Patient is being discharged due to not returning since the last visit.    Patient Name: Cindy Dodson MRN: [id number] DOB:06/04/1990, 32 y.o., female Today's Date: 10/02/2022  END OF SESSION:  PT End of Session - 10/02/22 0929     Visit Number 8    Date for PT Re-Evaluation 10/10/22    Authorization Type [employer] [id number]    PT Start Time [phone]    PT Stop Time 1000    PT Time Calculation (min) 31 min    Activity Tolerance Patient tolerated treatment well    Behavior During Therapy [facility] for tasks assessed/performed              Past Medical History:  Diagnosis Date   Anemia    microcytic hypochromic anemia   GERD (gastroesophageal reflux disease)    Hx of varicella    Hydronephrosis    Migraine    Past Surgical History:  Procedure Laterality Date   NO PAST SURGERIES     Patient Active Problem List   Diagnosis Date Noted   Symptomatic mammary hypertrophy 06/10/2022   Back pain 06/10/2022   Neck pain 06/10/2022   Coughing 06/28/2020   Other allergic rhinitis 06/28/2020   Heartburn 06/28/2020   Adverse reaction to food, subsequent encounter 06/28/2020   Family history of high cholesterol 01/17/2014   Numbness and tingling of right arm 11/03/2013   Arm pain, right 11/03/2013   Headache 06/29/2013   Skin lesion 06/29/2013   Family history of diabetes mellitus 06/29/2013   Family history of lung cancer 06/29/2013   Postpartum care following vaginal delivery  (8/27) 01/05/2013    PCP: Trudy Elodia PARAS, PA-C   REFERRING PROVIDER: Lowery Estefana RAMAN, DO   REFERRING DIAG:  R20.0,R20.2 (ICD-10-CM) - Numbness and tingling of right arm  N62  (ICD-10-CM) - Symptomatic mammary hypertrophy  M54.6,G89.29 (ICD-10-CM) - Chronic bilateral thoracic back pain  M54.2 (ICD-10-CM) - Neck pain    THERAPY DIAG:  Other low back pain  Pain in thoracic spine  Cervicalgia  Cramp and spasm  Muscle weakness (generalized)  Difficulty in walking, not elsewhere classified  Chronic right shoulder pain  Abnormal posture  Rationale for Evaluation and Treatment: Rehabilitation  ONSET DATE: years  SUBJECTIVE:  SUBJECTIVE STATEMENT: Patient reports her low back has been hurting again.  My neck is feeling a lot better since we did the DN there last time.  I'm so sorry I had to cancel my last appt, my RA was really flared up  PERTINENT HISTORY:  RA, Migraines from intracranial pressure, L sprained ankle  PAIN:  10/02/22: Are you having pain? Yes: NPRS scale: 5/10 Pain location: low back Pain description: ache Aggravating factors: normal ADLS Relieving factors: moving, sitting forward  Neck and shoulder pain is similar.  PRECAUTIONS: Other: INTRACRANIAL PRESSURE  WEIGHT BEARING RESTRICTIONS: No  FALLS:  Has patient fallen in last 6 months? Yes. 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 Increased tissue tension in right lumbar/QL, R paraspinals C/T/L,bil UT R>L Spinal Mobility: Decreased PA R Lumbar L3-L5/S1 painful, bil mid thoracic also painful  MUSCLE LENGTH:  Tight bil piriformis R>L, Bil HS L>R  CERVICAL ROM: Full  UPPER EXTREMITY ROM: Full  LOWER EXTREMITY ROM: Full  LUMBAR ROM: FULL - pain with flex, end range ext  UPPER EXTREMITY MMT: grossly 5/5 in BUE except:  MMT Right eval Left eval  Shoulder internal rotation    Shoulder external rotation    Middle trapezius 5 5  Lower trapezius 3+pain 4+   (Blank rows = not tested)   LOWER EXTREMITY MMT: grossly 5/5 in BlE except EXT R 4+/5   TODAY'S TREATMENT:                                                                                                                              DATE:  10/01/22 Elliptical x 3 min level 1 Supine PPT x 20 Supine hamstring stretch x 2 holding 30 sec each LE Supine IT band stretch x 2 holding 30 sec each LE Trigger Point Dry-Needling  Treatment instructions: Expect mild to moderate muscle soreness. S/S of pneumothorax if dry needled over a lung field, and to seek immediate medical attention should they occur. Patient verbalized understanding of these instructions and education. Patient Consent Given: Yes Education handout provided: Yes Muscles treated:bilateral lumbar multifidi  Electrical stimulation performed: No Parameters: N/A Treatment response/outcome: Skilled palpation used to identify taut bands and trigger points.  Once identified, dry needling techniques used to treat these areas.  Deep ache response ellicited.  Following treatment, patient reported increased ease of movement  and decreased low back pain   DATE:  09/23/22 UBE x 5 min (2.5/2.5) Tband shoulder extension, rows, bilateral ER and horizontal abduction x 20 each with  red tband (patient had to do sets of 5 on horizontal abduction due to fatigue and pain Shoulder rolls x 20 Trigger Point Dry-Needling  Treatment instructions: Expect mild to moderate muscle soreness. S/S of pneumothorax if dry needled over a lung field, and to seek immediate medical attention should they occur. Patient verbalized understanding of these instructions and education.  Patient Consent Given: Yes Education handout provided: Yes Muscles treated: bilateral upper traps, parascapular areas/rhomboids Electrical stimulation performed: No Parameters: N/A Treatment response/outcome: Skilled palpation used to identify taut bands and trigger points.  Once identified, dry needling techniques used to treat these areas.  Twitch response ellicited along with palpable elongation of muscle.  Following treatment, patient reported mild soreness but improved cervical    DATE:  09/18/22 Nustep L5 x 6 min Standing hamstring stretch 3 x 30 sec Standing quad stretch 3 x 30 sec  PPT x 20 PPT with 90/90 heel taps x 20 PPT with dying bug x 20 Hooklying trunk rotation x 20 Hooklying clam with yellow loop x 20  Hook lying bridge with yellow loop around knees x 20  Sidelying clam with yellow loop  x 10  PATIENT EDUCATION:  Education details: HEP update  Person educated: Patient Education method: Explanation, Demonstration, and Handouts Education comprehension: verbalized understanding and returned demonstration  HOME EXERCISE PROGRAM: Access Code: [email or web address] URL: [email or web address] Date: 09/02/2022 Prepared by: Mliss  Exercises - Sidelying Thoracic Rotation with Open Book  - 1 x daily - 7 x weekly - 2 sets - 5 reps - Standing Quadratus Lumborum Stretch with Doorway  - 2 x daily - 7 x weekly - 1 sets - 2 reps - 30-60 sec hold - Seated Piriformis Stretch with Trunk Bend  - 2 x daily - 7 x weekly - 1 sets - 3 reps - 30-60 sec  hold - Supine Hamstring Stretch with Strap  - 2 x daily -  7 x weekly - 2 sets - 3 reps - 30 sec hold - Thoracic Extension Mobilization on Foam Roll  - 2 x daily - 7 x weekly - 2 sets - 5 reps - Bird Dog with Knee Taps  - 1 x daily - 7 x weekly - 3 sets - 10 reps - Hooklying Sequential Leg March and Lower  - 1 x daily - 7 x weekly - 3 sets - 10 reps - Supine 90/90 Alternating Toe Touch  - 1 x daily - 7 x weekly - 3 sets - 10 reps - Standing Shoulder Row with Anchored Resistance  - 1 x daily - 4 x weekly - 1-3 sets - 10 reps - Shoulder extension with resistance - Neutral  - 1 x daily - 4 x weekly - 3 sets - 10 reps - Seated Lat Pull Down with Resistance - Elbows Bent  - 1 x daily - 4 x weekly - 3 sets - 10 reps - Prone Middle Trapezius Strengthening on Swiss Ball  - 1 x daily - 4 x weekly - 3 sets - 10 reps - Low Trap Setting at Wall  - 1 x daily - 4 x weekly - 3 sets - 10 reps - Squatting Anti-Rotation Press  - 1 x daily - 4 x weekly - 3 sets - 10 reps - Standing Trunk Rotation with Resistance  - 1 x daily -  4 x weekly - 3 sets - 10 reps  ASSESSMENT:  CLINICAL IMPRESSION: Destinie had good response to last session for her neck.  Her back pain was primary issue today.  She had good relief of symptoms after the DN session today.  She reported the deep ache response that is desired for DN.  She continues to work on movement with regard to her chronic pain.  Nzinga continues to demonstrate potential for improvement and would benefit from continued skilled therapy to address impairments.    OBJECTIVE IMPAIRMENTS: decreased activity tolerance, decreased strength, hypomobility, increased muscle spasms, impaired flexibility, and pain.   ACTIVITY LIMITATIONS: carrying, lifting, bending, sitting, standing, and sleeping  PARTICIPATION LIMITATIONS: ADLs are all painful  PERSONAL FACTORS: Time since onset of injury/illness/exacerbation and 3+ comorbidities: RA, Migraines from intracranial pressure, L sprained ankle are also affecting patient's functional  outcome.   REHAB POTENTIAL: Fair plan to address deficits above, but limited due to mammary hypertrophy  CLINICAL DECISION MAKING: Stable/uncomplicated  EVALUATION COMPLEXITY: Moderate   GOALS: Goals reviewed with patient? Yes  SHORT TERM GOALS: Target date: 08/07/2022   Patient will be independent with initial HEP.  Baseline:  Goal status: MET  2.  FOTO Completed at 2nd visit and goal updated  Baseline:  Goal status: Deferred due to time between visit one and two   LONG TERM GOALS: Target date: 08/28/2022  extended to 10/10/22  Patient will be independent with advanced/ongoing HEP to improve outcomes and carryover.  Baseline:  Goal status: MET 10/02/22  2.  Patient will report 75% improvement in low back, mid back, neck and shoulder pain to improve QOL.  Baseline:  Goal status: IN PROGRESS 5% improvement 08/28/22  3.  Patient to demonstrate ability to achieve and maintain good spinal alignment/posturing and body mechanics needed for daily activities. Baseline:  Goal status: IN PROGRESS  4.  Patient will demonstrate full pain free lumbar ROM to perform ADLs.   Baseline:  Goal status: IN PROGRESS 08/28/22 pain with extension  5.  Patient will demonstrate improved upper and lower extremity strength to 5/5. Baseline:  Goal status: IN PROGRESS  6.  Patient will report predicted improvement on lumbar FOTO to demonstrate improved functional ability.  Baseline: Deferred Goal status: REVISED      PLAN:  PT FREQUENCY: 2x/week  PT DURATION: 6 weeks  PLANNED INTERVENTIONS: Therapeutic exercises, Therapeutic activity, Neuromuscular re-education, Patient/Family education, Self Care, Joint mobilization, Dry Needling, Electrical stimulation, Spinal mobilization, Cryotherapy, Taping, Traction, Ultrasound, Ionotophoresis 4mg /ml Dexamethasone, and Manual therapy  PLAN FOR NEXT SESSION:  Continue scapular and postural strengthening, thoracic spine mobility, spinal stab and core  strength, body mechanic/ADL ed DN as needed for muscle spasm and trigger points.   Delon B. Slyvia Lartigue, PT 12/24/23 8:31 AM  Delon B. Eadie Repetto, PT 10/02/22 10:15 AM  [facility] [address] [place], [place] [address] Phone # [phone] Fax [phone]

## 2022-10-08 NOTE — Progress Notes (Unsigned)
   Referring Provider Roger Kill, PA-C [id number] [place],  [place] [address]   CC: No chief complaint on file.     Cindy Dodson is an 33 y.o. female.  HPI: Patient is a 32 y.o. year old female here for follow up after completing physical therapy for pain related to macromastia. She reports ***  Review of Systems General: ***  Physical Exam    06/10/2022    9:05 AM 02/14/2022    9:57 AM 06/28/2020    2:01 PM  Vitals with BMI  Height 5\' 4"  5\' 4"    Weight 256 lbs 253 lbs 11 oz   BMI 43.92 43.53   Systolic 130  122  Diastolic 85  72  Pulse 103  79    General:  No acute distress,  Alert and oriented, Non-Toxic, Normal speech and affect Psych: Normal behavior and mood ***   Assessment/Plan ***  Patient is interested in pursuing surgical intervention for bilateral breast reduction. Patient has completed at least 6 weeks of physical therapy for pain related to macromastia.  Discussed with patient we would submit to insurance for authorization, discussed approval could take up to 6 weeks.   Cindy Dodson 10/08/2022, 3:07 PM

## 2022-10-09 ENCOUNTER — Encounter: Payer: Self-pay | Admitting: Surgical

## 2022-10-09 ENCOUNTER — Ambulatory Visit (INDEPENDENT_AMBULATORY_CARE_PROVIDER_SITE_OTHER): Payer: 59 | Admitting: Surgical

## 2022-10-09 ENCOUNTER — Ambulatory Visit: Payer: 59 | Admitting: Surgical

## 2022-10-09 VITALS — BP 123/85 | HR 83 | Ht 64.0 in | Wt 246.0 lb

## 2022-10-09 DIAGNOSIS — M546 Pain in thoracic spine: Secondary | ICD-10-CM | POA: Diagnosis not present

## 2022-10-09 DIAGNOSIS — M542 Cervicalgia: Secondary | ICD-10-CM

## 2022-10-09 DIAGNOSIS — G8929 Other chronic pain: Secondary | ICD-10-CM

## 2022-10-09 DIAGNOSIS — N62 Hypertrophy of breast: Secondary | ICD-10-CM

## 2022-10-09 DIAGNOSIS — Z6841 Body Mass Index (BMI) 40.0 and over, adult: Secondary | ICD-10-CM | POA: Diagnosis not present

## 2022-10-10 ENCOUNTER — Ambulatory Visit: Payer: 59 | Admitting: Surgical

## 2022-12-10 ENCOUNTER — Ambulatory Visit (INDEPENDENT_AMBULATORY_CARE_PROVIDER_SITE_OTHER): Payer: 59 | Admitting: Surgical

## 2022-12-10 DIAGNOSIS — G8929 Other chronic pain: Secondary | ICD-10-CM

## 2022-12-10 DIAGNOSIS — M542 Cervicalgia: Secondary | ICD-10-CM

## 2022-12-10 DIAGNOSIS — N62 Hypertrophy of breast: Secondary | ICD-10-CM | POA: Diagnosis not present

## 2022-12-10 NOTE — Progress Notes (Signed)
   Referring Provider Roger Kill, PA-C [phone] [place],  [place] [address]   CC: No chief complaint on file.     Cindy Dodson is an 32 y.o. female.  HPI: Patient is a 32 year old female who presents via telephone to discuss symptomatic mammary hypertrophy.  She initially had a consult with Dr. Ulice Bold on 06/10/2022 to discuss symptomatic mammary hypertrophy.  She has completed physical therapy, we have discussed this at a previous visit.  She continues to have neck and back pain despite PT treatments and weight loss.  She was last seen in the office on 10/09/2022.  At that time we discussed that she was continuing to work on weight loss and she was interested in continuing this journey prior to scheduling surgery/submitting to insurance.  Today she reports that she has continued to lose weight, but feels as if her breast size is hindering her from doing more activity to assist with weight loss.  She reports that she is on Wegovy as well.  She is aware that she will need to stop this prior to surgery.  She reports she has probably lost about 5 to 10 pounds since our last appointment 2 months ago.  The patient gave consent to have this visit done by telemedicine / virtual visit, two identifiers were used to identify patient. This is also consent for access the chart and treat the patient via this visit. The patient is located in [place].  I, the provider, am at the office.  We spent 5 minutes together for the visit.  Joined by phone.   Review of Systems MSK: Neck and back pain  Physical Exam    10/09/2022   10:00 AM 06/10/2022    9:05 AM 02/14/2022    9:57 AM  Vitals with BMI  Height 5\' 4"  5\' 4"  5\' 4"   Weight 246 lbs 256 lbs 253 lbs 11 oz  BMI 42.21 43.92 43.53  Systolic 123 130   Diastolic 85 85   Pulse 83 103     Behavior and mood is normal.    Assessment/Plan Discussed with patient we could submit to insurance for bilateral breast  reduction with Dr. Ulice Bold.  We discussed that the typical timeline for receiving authorization after submission is approximately 6 weeks, however timeline can be extended due to surgical scheduling log.  Patient was understanding of this.  Patient is a good candidate for bilateral breast reduction with Dr. Ulice Bold, we will submit to insurance and route to surgical scheduling staff today.  All the patient's questions were answered to her content via telephone today.  She consented to telephone visit.  We will plan to reach out to her once we receive response from her insurance.  Kermit Balo Raymond Bhardwaj 12/10/2022, 9:04 AM

## 2022-12-31 ENCOUNTER — Other Ambulatory Visit: Payer: Self-pay | Admitting: Surgical

## 2022-12-31 ENCOUNTER — Other Ambulatory Visit: Payer: Self-pay | Admitting: Plastic Surgery

## 2022-12-31 DIAGNOSIS — N62 Hypertrophy of breast: Secondary | ICD-10-CM

## 2022-12-31 NOTE — Progress Notes (Signed)
Surgery request 

## 2023-01-07 ENCOUNTER — Telehealth: Payer: 59 | Admitting: Surgical

## 2023-01-08 ENCOUNTER — Telehealth: Payer: Self-pay | Admitting: Plastic Surgery

## 2023-01-08 NOTE — Telephone Encounter (Signed)
Patient called and is requesting an update on her insurance approval and surgery.  Please call her at [phone]
